# Patient Record
Sex: Male | Born: 1985 | Race: White | Hispanic: No | Marital: Married | Smoking: Former smoker
Health system: Southern US, Community
[De-identification: ages and names within clinical notes are randomized; demographics above are authoritative.]

---

## 2009-06-16 ENCOUNTER — Ambulatory Visit (HOSPITAL_COMMUNITY): Admission: RE | Admit: 2009-06-16 | Discharge: 2009-06-16 | Payer: Self-pay | Admitting: Family Medicine

## 2020-06-03 ENCOUNTER — Encounter: Payer: Self-pay | Admitting: Physical Medicine and Rehabilitation

## 2020-07-17 ENCOUNTER — Encounter: Payer: Self-pay | Admitting: Physical Medicine and Rehabilitation

## 2020-07-17 ENCOUNTER — Other Ambulatory Visit: Payer: Self-pay

## 2020-07-17 ENCOUNTER — Encounter
Payer: No Typology Code available for payment source | Attending: Physical Medicine and Rehabilitation | Admitting: Physical Medicine and Rehabilitation

## 2020-07-17 VITALS — BP 154/92 | HR 75 | Temp 99.0°F | Ht 72.0 in | Wt 265.4 lb

## 2020-07-17 DIAGNOSIS — M7918 Myalgia, other site: Secondary | ICD-10-CM | POA: Diagnosis not present

## 2020-07-17 DIAGNOSIS — Z6835 Body mass index (BMI) 35.0-35.9, adult: Secondary | ICD-10-CM

## 2020-07-17 DIAGNOSIS — E669 Obesity, unspecified: Secondary | ICD-10-CM | POA: Insufficient documentation

## 2020-07-17 MED ORDER — CYCLOBENZAPRINE HCL 10 MG PO TABS: 10.0000 mg | ORAL_TABLET | Freq: Two times a day (BID) | ORAL | 1 refills | Status: AC | PRN

## 2020-07-17 MED ORDER — CELECOXIB 200 MG PO CAPS: 200.0000 mg | ORAL_CAPSULE | Freq: Every day | ORAL | 1 refills | Status: DC

## 2020-07-17 NOTE — Progress Notes (Signed)
Subjective:    Patient ID: Ethan Brooks, male    DOB: May 23, 1986, 35 y.o.   MRN: [id number]  HPI  Ethan Brooks is a 35 year old man who presents to establish care for herniated L5 disc.   1) L5 disc herniation: He recently retired from [employer] in July. 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 This started in 2019. He developed right sided sciatic pain. He received stretches and dry needling. He was tried on medications, physical therapy. He takes a celebrex every day and this does help. His pain is worse in the morning. He has tried Flexeril and this helped in the beginning.   2) Cervical myofscial pain syndorme: he feels a lot of muscle spasms in his upper back and neck. He has tried dry needling and when this was removed he had very good relief.    Pain Inventory Average Pain 4 Pain Right Now 6 My pain is constant, stabbing and aching  In the last 24 hours, has pain interfered with the following? General activity 1 Relation with others 3 Enjoyment of life 3 What TIME of day is your pain at its worst? morning  Sleep (in general) Fair  Pain is worse with: inactivity Pain improves with: therapy/exercise and medication Relief from Meds: 8  walk without assistance how many minutes can you walk? 120+ ability to climb steps?  yes do you drive?  yes  disabled: date disabled 12/20/2020 retired  depression anxiety  new pt  new pt    Family History  Problem Relation Age of Onset  . Diabetes Father   . Heart disease Father    Social History   Socioeconomic History  . Marital status: Married    Spouse name: Not on file  . Number of children: Not on file  . Years of education: Not on file  . Highest education level: Not on file  Occupational History  . Not on file  Tobacco Use  . Smoking status: Former Smoker    Quit date: 06/23/2020    Years since quitting:  0.0  . Smokeless tobacco: Not on file  Substance and Sexual Activity  . Alcohol use: Yes    Alcohol/week: 3.0 standard drinks    Types: 3 Standard drinks or equivalent per week    Comment: occassionally  . Drug use: Not on file  . Sexual activity: Not on file  Other Topics Concern  . Not on file  Social History Narrative  . Not on file   Social Determinants of Health   Financial Resource Strain: Not on file  Food Insecurity: Not on file  Transportation Needs: Not on file  Physical Activity: Not on file  Stress: Not on file  Social Connections: Not on file   No past medical history on file. BP (!) 154/92   Pulse 75   Temp 99 F (37.2 C)   Ht 6' (1.829 m)   Wt 265 lb 6.4 oz (120.4 kg)   SpO2 97%   BMI 35.99 kg/m   Opioid Risk Score:   Fall Risk Score:  `1  Depression screen PHQ 2/9  Depression screen PHQ 2/9 07/17/2020  Decreased Interest 1  Down, Depressed, Hopeless 1  PHQ - 2 Score 2  Altered sleeping 1  Tired, decreased energy 1  Change in appetite 1  Feeling bad or failure about yourself  1  Trouble concentrating 1  Moving slowly or fidgety/restless 1  Suicidal thoughts 0  PHQ-9 Score 8  Difficult doing work/chores Somewhat difficult   Review of Systems  Musculoskeletal: Positive for back pain and neck pain.       Shoulder pain Hip pain Knee pain  All other systems reviewed and are negative.      Objective:   Physical Exam Gen: no distress, normal appearing HEENT: oral mucosa pink and moist, NCAT Cardio: Reg rate Chest: normal effort, normal rate of breathing Abd: soft, non-distended Ext: no edema Psych: pleasant, normal affect Skin: intact Neuro: Alert and oriented  Musculoskeletal: Excellent range of motion on flexion and extension. Tenderness to palpation in upper thoracic spine.      Assessment & Plan:  Ethan Brooks is a 35 year old man who presents to establish care for spinal pain.   1) Cervical myofascial pain syndrome: -Physical  therapy prescribed for stretching and strengthening the muscles of the upper back, HEP. -Discussed benefits of heat, trigger point injections- advised him to let me know if he wants this. -He was on Marinol for chronic pain before. This greatly benefited him. I discussed with our medical director and we do not prescribe Marinol for chronic pain. I will call patient to let him now.  -Prescribed Celebrex 200mg  daily PRN -Prescribed Flexeril 5mg  and advised to use PRN.  -Discussed current symptoms of pain and history of pain.  -Discussed benefits of exercise in reducing pain. -Discussed following foods that may reduce pain: 1) Ginger 2) Blueberries 3) Salmon 4) Pumpkin seeds 5) dark chocolate 6) turmeric 7) tart cherries 8) virgin olive oil 9) chilli peppers 10) mint  Turmeric to reduce inflammation--can be used in cooking or taken as a supplement.  Benefits of turmeric:  -Highly anti-inflammatory  -Increases antioxidants  -Improves memory, attention, brain disease  -Lowers risk of heart disease  -May help prevent cancer  -Decreases pain  -Alleviates depression  -Delays aging and decreases risk of chronic disease  -Consume with black pepper to increase absorption    Turmeric Milk Recipe:  1 cup milk  1 tsp turmeric  1 tsp cinnamon  1 tsp grated ginger (optional)  Black pepper (boosts the anti-inflammatory properties of turmeric).  1 tsp honey  Link to further information on diet for chronic pain:   2) Obesity:  -35.99 BMI, educated that weight loss will also help with pain.  -Recommended exercising as much as tolerated -Recommended down dog app and doing 15mg  of yoga every day.

## 2020-07-17 NOTE — Patient Instructions (Signed)
-  Discussed following foods that may reduce pain: 1) Ginger 2) Blueberries 3) Salmon 4) Pumpkin seeds 5) dark chocolate 6) turmeric 7) tart cherries 8) virgin olive oil 9) chilli peppers 10) mint  Link to further information on diet for chronic pain: http://www.bray.com/   Turmeric to reduce inflammation--can be used in cooking or taken as a supplement.  Benefits of turmeric:  -Highly anti-inflammatory  -Increases antioxidants  -Improves memory, attention, brain disease  -Lowers risk of heart disease  -May help prevent cancer  -Decreases pain  -Alleviates depression  -Delays aging and decreases risk of chronic disease  -Consume with black pepper to increase absorption    Turmeric Milk Recipe:  1 cup milk  1 tsp turmeric  1 tsp cinnamon  1 tsp grated ginger (optional)  Black pepper (boosts the anti-inflammatory properties of turmeric).  1 tsp honey

## 2020-07-18 ENCOUNTER — Other Ambulatory Visit: Payer: Self-pay | Admitting: Physical Medicine and Rehabilitation

## 2020-08-12 ENCOUNTER — Ambulatory Visit
Payer: No Typology Code available for payment source | Attending: Physical Medicine and Rehabilitation | Admitting: Physical Therapy

## 2020-08-12 ENCOUNTER — Other Ambulatory Visit: Payer: Self-pay

## 2020-08-12 ENCOUNTER — Encounter: Payer: Self-pay | Admitting: Physical Therapy

## 2020-08-12 DIAGNOSIS — M546 Pain in thoracic spine: Secondary | ICD-10-CM | POA: Diagnosis not present

## 2020-08-12 DIAGNOSIS — R29898 Other symptoms and signs involving the musculoskeletal system: Secondary | ICD-10-CM | POA: Insufficient documentation

## 2020-08-12 DIAGNOSIS — M542 Cervicalgia: Secondary | ICD-10-CM | POA: Insufficient documentation

## 2020-08-12 NOTE — Therapy (Signed)
[facility] [address] [place], [place], [address] Phone: [phone]   Fax:  [phone]  Physical Therapy Treatment  Patient Details  Name: Ethan Brooks MRN: [id number] Date of Birth: 1986-02-08 Referring Provider (PT): Dr. Sula Soda   Encounter Date: 08/12/2020   PT End of Session - 08/12/20 1206    Visit Number 1    Number of Visits 12    Date for PT Re-Evaluation 09/23/20    Authorization Type [facility]    PT Start Time 1123    PT Stop Time 1208    PT Time Calculation (min) 45 min    Activity Tolerance Patient tolerated treatment well    Behavior During Therapy [facility] for tasks assessed/performed           History reviewed. No pertinent past medical history.  History reviewed. No pertinent surgical history.  There were no vitals filed for this visit.   Subjective Assessment - 08/12/20 1129    Subjective Patient presents with chronic upper back pain, worsening in the past few months He admits to losing momnetum with exercisese, poor posture.  He does notice increased pain with stress.  He denies weakness in arms, tinging or radiation of pain .  He does have chronic sciatic nerve pain from a previous injury. 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 He lives a with a certain amount of pain and reports not being physically limited by his pain    Pertinent History L5 disc herniation  July 2019    Diagnostic tests none    Currently in Pain? Yes    Pain Score 4     Pain Location Back    Pain Orientation Right;Upper    Pain Descriptors / Indicators Sore;Tightness    Pain Type Chronic pain    Pain Onset More than a month ago    Pain Frequency Intermittent    Aggravating Factors  being sedentary    Pain Relieving Factors activity, PT    Effect of Pain on Daily Activities feels less energy, overall not as strong as she would like to be    Multiple Pain Sites No              [facility]  PT Assessment - 08/12/20 0001      Assessment   Medical Diagnosis cervical pain, myofascial pain    Referring Provider (PT) Dr. 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[employer], Microbiologist, friends , dog      Cognition   Overall Cognitive Status Within Functional Limits for tasks assessed      Observation/Other Assessments   Focus on Therapeutic Outcomes (FOTO)  59%      Sensation   Light Touch Appears Intact      Posture/Postural Control   Posture/Postural Control Postural limitations    Postural Limitations  Rounded Shoulders;Forward head;Increased thoracic kyphosis    Posture Comments rounded , scapula abducted downward sloped shoulders      AROM   Cervical Flexion 55    Cervical Extension 70    Cervical - Right Side Bend 55    Cervical - Left Side Bend 58    Cervical - Right Rotation WFL    Cervical - Left Rotation WFL    Lumbar Flexion WNL    Lumbar Extension WNL    Lumbar - Right Side Bend WNL    Lumbar - Left Side Bend WNL pain in R    Lumbar - Right Rotation WNL    Lumbar - Left Rotation WNL      Strength   Right Shoulder Flexion 5/5    Right Shoulder ABduction 5/5    Right Shoulder Internal Rotation 5/5    Right Shoulder External Rotation 5/5    Right Shoulder Horizontal ABduction 4/5    Left Shoulder Flexion 5/5    Left Shoulder ABduction 5/5    Left Shoulder Internal Rotation 5/5    Left Shoulder External Rotation 5/5    Left Shoulder Horizontal ABduction 4/5      Palpation   Spinal mobility  normal throughout cervical and mild stiffness in thoracic spine    Palpation comment no pain with cervical palpation, tenderness in upper traps, levator scap rhomboids, paraspinals in thoracic spine                  PT Education - 08/12/20 1508    Education Details posture, PT, strengthening    Person(s) Educated Patient    Methods Explanation;Handout    Comprehension Verbalized understanding               PT Long Term Goals - 08/12/20 1207      PT LONG TERM GOAL #1   Title Patient will increase physical activity and be consistent with daily HEP for strengthening, mobility.    Time 6    Period Weeks    Status New    Target Date 09/23/20      PT LONG TERM GOAL #2   Title Patient will have normal AROM in cervical spine and no pain    Baseline min pain with extension and lateral flexion    Time 6    Period Weeks    Status New    Target Date 09/23/20      PT LONG TERM GOAL #3   Title Pt will be able to lift with proper form , floor to waist /shoulder height with no increase in pain (50 lbs)    Time 6    Period Weeks    Status New    Target Date 09/23/20      PT LONG TERM GOAL #4   Title Pt will improve FOTO score to 79% or more to demo improved functional mobility    Baseline 59%    Time 6    Period Weeks    Status New    Target Date 09/23/20                 Plan - 08/12/20 1511    Clinical Impression Statement Patient presents for low complexity eval of neck and uppoer back pain which is ongoing and chronic. He is under the care of pain mgmt for a previous lumbar injury, which was treated successfully with PT in 2019. He presents with mild limitations in cervical ROM, strength in periscapular area and poor posture resulting  in a baseline of upper back pain and tighrtness.  He would like to be able to get back on a core routine and is hopeful that some progress will give him the motivation he needs to feel stronger and more capable.  He should do quite  well with 8-12 visits of PT.    Personal Factors and Comorbidities Comorbidity 1    Comorbidities lumbar injury    Examination-Activity Limitations Bend;Lift;Squat;Carry    Examination-Participation Restrictions Yard Work    Stability/Clinical Decision Making Stable/Uncomplicated    Clinical Decision Making Low    Rehab Potential Excellent    PT Frequency 2x / week   1-2 times per week   PT Duration 6 weeks    PT Treatment/Interventions ADLs/Self Care Home Management;Cryotherapy;Therapeutic exercise;Patient/family education;Taping;Manual techniques;Dry needling;Therapeutic activities;Moist Heat    PT Next Visit Plan check HEP, post chain /postural strength.  DL, squat    PT Home Exercise Plan Access Code: [email or web address]  see pt instructions    Consulted and Agree with Plan of Care Patient           Patient will benefit from skilled therapeutic intervention in order to improve the following deficits and impairments:  Pain,Impaired UE functional use,Impaired flexibility,Increased fascial restricitons,Postural dysfunction,Decreased strength,Obesity  Visit Diagnosis: Pain in thoracic spine  Other symptoms and signs involving the musculoskeletal system  Cervicalgia     Problem List There are no problems to display for this patient.   Vaneta Hammontree 08/12/2020, 3:20 PM  [facility] [address] [place], [place], [address] Phone: [phone]   Fax:  [phone]  Name: Ethan Brooks MRN: [id number] Date of Birth: 1985/07/30  Karie Mainland, PT 08/12/20 3:21 PM Phone: [phone] Fax: [phone]

## 2020-08-12 NOTE — Patient Instructions (Signed)
[email or web address]   Access Code: [email or web address]: [email or web address]: 03/01/2022Prepared by: Victorino Dike PaaExercises  Seated Cervical Sidebending Stretch - 2 x daily - 7 x weekly - 1 sets - 3 reps - 30 hold  Seated Levator Scapulae Stretch - 2 x daily - 7 x weekly - 1 sets - 3 reps - 30 hold  Supine Shoulder Horizontal Abduction with Resistance - 2 x daily - 7 x weekly - 2 sets - 10 reps - 5 hold  Corner Pec Major Stretch - 2 x daily - 7 x weekly - 1 sets - 5 reps - 30 hold  Prone Scapular Retraction - 1 x daily - 7 x weekly - 2 sets - 10 reps - 5 hold  Prone Scapular Retraction Arms at Side - 1 x daily - 7 x weekly - 2 sets - 10 reps - 5 hold  Prone W Scapular Retraction - 1 x daily - 7 x weekly - 2 sets - 10 reps - 5 hold

## 2020-08-14 ENCOUNTER — Other Ambulatory Visit: Payer: Self-pay

## 2020-08-14 ENCOUNTER — Encounter: Payer: Self-pay | Admitting: Physical Therapy

## 2020-08-14 ENCOUNTER — Ambulatory Visit: Payer: No Typology Code available for payment source | Admitting: Physical Therapy

## 2020-08-14 DIAGNOSIS — M546 Pain in thoracic spine: Secondary | ICD-10-CM | POA: Diagnosis not present

## 2020-08-14 DIAGNOSIS — R29898 Other symptoms and signs involving the musculoskeletal system: Secondary | ICD-10-CM

## 2020-08-14 DIAGNOSIS — M542 Cervicalgia: Secondary | ICD-10-CM

## 2020-08-14 NOTE — Therapy (Signed)
[facility] [address] [place], [place], [address] Phone: [phone]   Fax:  [phone]  Physical Therapy Treatment  Patient Details  Name: Ethan Brooks MRN: [id number] Date of Birth: [birth year] Referring Provider (PT): Dr. Sula Soda   Encounter Date: 08/14/2020   PT End of Session - 08/14/20 0839    Visit Number 2    Number of Visits 12    Date for PT Re-Evaluation 09/23/20    Authorization Type [facility]    PT Start Time 0800    PT Stop Time 0845    PT Time Calculation (min) 45 min           History reviewed. No pertinent past medical history.  History reviewed. No pertinent surgical history.  There were no vitals filed for this visit.   Subjective Assessment - 08/14/20 0809    Subjective "I haven't gotten around to do my exercises I've been doing my renovation at home."    Currently in Pain? Yes    Pain Score 3     Pain Location Thoracic    Pain Orientation Left    Pain Descriptors / Indicators Aching;Tightness    Pain Type Chronic pain    Pain Onset More than a month ago    Pain Frequency Intermittent    Aggravating Factors  sitting for long periods of time                             [facility] Adult PT Treatment/Exercise - 08/14/20 0001      Neck Exercises: Machines for Strengthening   UBE (Upper Arm Bike) L5 x 4 min   fwd/bwd x 2 min ea.     Shoulder Exercises: Stretch   Other Shoulder Stretches crossed arm rhomboid stretch 2 x 30 sec   cues to only hold 30 seconds     Manual Therapy   Manual Therapy Soft tissue mobilization;Joint mobilization    Manual therapy comments skilled palpation and monitoring thorughout DN    Joint Mobilization T4-T10 PA grade IV    Soft tissue mobilization IASTM along bil thoracic paraspinals            Trigger Point Dry Needling - 08/14/20 0001    Consent Given? Yes    Education Handout Provided Yes    Muscles Treated Back/Hip Thoracic multifidi     Electrical Stimulation Performed with Dry Needling Yes    E-stim with Dry Needling Details frequency at 20 - intensity to tolerance and increasing to tolerance intermittently x 8 min    Thoracic multifidi response Palpable increased muscle length;Twitch response elicited   bil T5 & T8               PT Education - 08/14/20 0839    Education Details reviewed HEP and discussed only stretching for 30 second and longer isn't better. Reivewed DN and benefits of treatment.    Person(s) Educated Patient    Methods Explanation;Verbal cues    Comprehension Verbalized understanding;Verbal cues required               PT Long Term Goals - 08/12/20 1207      PT LONG TERM GOAL #1   Title Patient will increase physical activity and be consistent with daily HEP for strengthening, mobility.    Time 6    Period Weeks    Status New    Target Date 09/23/20  PT LONG TERM GOAL #2   Title Patient will have normal AROM in cervical spine and no pain    Baseline min pain with extension and lateral flexion    Time 6    Period Weeks    Status New    Target Date 09/23/20      PT LONG TERM GOAL #3   Title Pt will be able to lift with proper form , floor to waist /shoulder height with no increase in pain (50 lbs)    Time 6    Period Weeks    Status New    Target Date 09/23/20      PT LONG TERM GOAL #4   Title Pt will improve FOTO score to 79% or more to demo improved functional mobility    Baseline 59%    Time 6    Period Weeks    Status New    Target Date 09/23/20                 Plan - 08/14/20 0845    Clinical Impression Statement pt reprots limited compliance with HEP due to renvating his home. reviewed stretch and to avoid over stretching to avoid potentially causing muscle aggrivation. educated and consent was provided for TPDN for bil thoracic multifidi combined with E-stim. followed with mobs and IASTM techniques. end of session he reported decreased pain/ tension.     PT Treatment/Interventions ADLs/Self Care Home Management;Cryotherapy;Therapeutic exercise;Patient/family education;Taping;Manual techniques;Dry needling;Therapeutic activities;Moist Heat    PT Next Visit Plan check HEP, post chain /postural strength.  DL, squat, response to DN.    PT Home Exercise Plan Access Code: [email or web address]  see pt instructions    Consulted and Agree with Plan of Care Patient           Patient will benefit from skilled therapeutic intervention in order to improve the following deficits and impairments:  Pain,Impaired UE functional use,Impaired flexibility,Increased fascial restricitons,Postural dysfunction,Decreased strength,Obesity  Visit Diagnosis: Pain in thoracic spine  Other symptoms and signs involving the musculoskeletal system  Cervicalgia     Problem List There are no problems to display for this patient.   Lulu Riding PT, DPT, LAT, ATC  08/14/20  8:47 AM      [facility] [address] [place], [place], [address] Phone: [phone]   Fax:  [phone]  Name: Ethan Brooks MRN: [id number] Date of Birth: 26-Oct-1985

## 2020-09-01 ENCOUNTER — Other Ambulatory Visit: Payer: Self-pay | Admitting: *Deleted

## 2020-09-01 MED ORDER — CELECOXIB 200 MG PO CAPS: ORAL_CAPSULE | ORAL | 0 refills | Status: AC

## 2020-09-02 ENCOUNTER — Other Ambulatory Visit: Payer: Self-pay

## 2020-09-02 ENCOUNTER — Ambulatory Visit: Payer: No Typology Code available for payment source | Admitting: Physical Therapy

## 2020-09-02 DIAGNOSIS — M546 Pain in thoracic spine: Secondary | ICD-10-CM | POA: Diagnosis not present

## 2020-09-02 DIAGNOSIS — M542 Cervicalgia: Secondary | ICD-10-CM

## 2020-09-02 DIAGNOSIS — R29898 Other symptoms and signs involving the musculoskeletal system: Secondary | ICD-10-CM

## 2020-09-02 NOTE — Therapy (Signed)
[facility] Outpatient Rehabilitation [facility] [address] [place], [place], [address] Phone: [phone]   Fax:  [phone]  Physical Therapy Treatment  Patient Details  Name: RAMIE ERMAN MRN: [id number] Date of Birth: 08-25-1985 Referring Provider (PT): Dr. Sula Soda   Encounter Date: 09/02/2020   PT End of Session - 09/02/20 1336    Visit Number 3    Number of Visits 12    Date for PT Re-Evaluation 09/23/20    Authorization Type [facility]    Authorization - Visit Number 3    Authorization - Number of Visits 13    PT Start Time 1334    PT Stop Time 1412    PT Time Calculation (min) 38 min    Activity Tolerance Patient tolerated treatment well    Behavior During Therapy [facility] for tasks assessed/performed           No past medical history on file.  No past surgical history on file.  There were no vitals filed for this visit.   Subjective Assessment - 09/02/20 1337    Subjective "I am doing phenominal today, I was alittle sore after the last session for a day to day."    Currently in Pain? Yes    Pain Score 1     Pain Orientation Left              [facility] PT Assessment - 09/02/20 0001      Assessment   Medical Diagnosis cervical pain, myofascial pain    Referring Provider (PT) Dr. Sula Soda                         [facility] Adult PT Treatment/Exercise - 09/02/20 0001      Self-Care   Self-Care Other Self-Care Comments    Other Self-Care Comments  how to perform Self trigger point release and tools that can assist with treatment      Neck Exercises: Seated   Other Seated Exercise rows with green theraband x 20, money x 20, shoulder extension x 20, horizontal abd x 20, marches LE only x 20,   seated on green physioball     Lumbar Exercises: Stretches   Other Lumbar Stretch Exercise low back stretch seated walking hands out onto physioball 2 x forward, 1x to R/ L holding 30 sec      Lumbar Exercises: Aerobic    Elliptical L3 x 5 min ramp L 10      Lumbar Exercises: Seated   Other Seated Lumbar Exercises alt UE/LE marches x 20      Lumbar Exercises: Supine   Other Supine Lumbar Exercises laying on fosm roller parallel to spine marching 2 x 10 alt l/r   cues to keep core tight and let hands hover to reduce falling     Shoulder Exercises: Supine   Other Supine Exercises thoracic exgension over bolser with hands behind head and elbows adducted 1 x 10 holding 3 sec ea.      Shoulder Exercises: Stretch   Other Shoulder Stretches pec stretch laying on foam roller 2 x 20                  PT Education - 09/02/20 1412    Education Details reviewed sitting posture using rolled towel behind back.    Person(s) Educated Patient    Methods Explanation;Verbal cues;Handout    Comprehension Verbalized understanding;Verbal cues required  PT Long Term Goals - 08/12/20 1207      PT LONG TERM GOAL #1   Title Patient will increase physical activity and be consistent with daily HEP for strengthening, mobility.    Time 6    Period Weeks    Status New    Target Date 09/23/20      PT LONG TERM GOAL #2   Title Patient will have normal AROM in cervical spine and no pain    Baseline min pain with extension and lateral flexion    Time 6    Period Weeks    Status New    Target Date 09/23/20      PT LONG TERM GOAL #3   Title Pt will be able to lift with proper form , floor to waist /shoulder height with no increase in pain (50 lbs)    Time 6    Period Weeks    Status New    Target Date 09/23/20      PT LONG TERM GOAL #4   Title Pt will improve FOTO score to 79% or more to demo improved functional mobility    Baseline 59%    Time 6    Period Weeks    Status New    Target Date 09/23/20                 Plan - 09/02/20 1413    Clinical Impression Statement pt arrived noting significant relief of pain noting 1/1 today. focused on addressing core and shoulder in both  supine and seated position. practiced strengthening on unstable surfaces to require core activation and maintaining efficient posture. Pt responded well treatment and noted no increaed in pain following session.    PT Treatment/Interventions ADLs/Self Care Home Management;Cryotherapy;Therapeutic exercise;Patient/family education;Taping;Manual techniques;Dry needling;Therapeutic activities;Moist Heat    PT Next Visit Plan check HEP, post chain /postural strength.  DL, squat, response to DN PRN, continued working on aggressive shoulder and core strength.    Consulted and Agree with Plan of Care Patient           Patient will benefit from skilled therapeutic intervention in order to improve the following deficits and impairments:  Pain,Impaired UE functional use,Impaired flexibility,Increased fascial restricitons,Postural dysfunction,Decreased strength,Obesity  Visit Diagnosis: Pain in thoracic spine  Other symptoms and signs involving the musculoskeletal system  Cervicalgia     Problem List There are no problems to display for this patient.   Lulu Riding PT, DPT, LAT, ATC  09/02/20  2:16 PM      [facility] [address] [place], [place], [address] Phone: [phone]   Fax:  [phone]  Name: MAIKA KACZMAREK MRN: [id number] Date of Birth: 09-21-1985

## 2020-09-04 ENCOUNTER — Ambulatory Visit: Payer: No Typology Code available for payment source | Admitting: Physical Therapy

## 2020-09-04 ENCOUNTER — Other Ambulatory Visit: Payer: Self-pay

## 2020-09-04 DIAGNOSIS — M542 Cervicalgia: Secondary | ICD-10-CM

## 2020-09-04 DIAGNOSIS — R29898 Other symptoms and signs involving the musculoskeletal system: Secondary | ICD-10-CM

## 2020-09-04 DIAGNOSIS — M546 Pain in thoracic spine: Secondary | ICD-10-CM

## 2020-09-04 NOTE — Therapy (Signed)
[facility] Outpatient Rehabilitation [facility] [address] [place], [place], [address] Phone: [phone]   Fax:  [phone]  Physical Therapy Treatment  Patient Details  Name: Ethan Brooks MRN: [id number] Date of Birth: 31-May-1986 Referring Provider (PT): Dr. Sula Soda   Encounter Date: 09/04/2020   PT End of Session - 09/04/20 1420    Visit Number 4    Number of Visits 12    Date for PT Re-Evaluation 09/23/20    Authorization Time Period 05/22/2020 - 11/18/2020    Authorization - Visit Number 4    Authorization - Number of Visits 13    PT Start Time 1330    PT Stop Time 1415    PT Time Calculation (min) 45 min    Activity Tolerance Patient tolerated treatment well    Behavior During Therapy [facility] for tasks assessed/performed           No past medical history on file.  No past surgical history on file.  There were no vitals filed for this visit.   Subjective Assessment - 09/04/20 1334    Subjective "I am doing pretty good, but can feel like it is coming back."    Pain Score 3     Pain Orientation Left    Pain Descriptors / Indicators Aching;Tightness    Pain Type Chronic pain    Pain Onset More than a month ago    Pain Frequency Intermittent    Aggravating Factors  sitting for long periods of time    Pain Relieving Factors activity, exercise.              [facility] PT Assessment - 09/04/20 0001      Assessment   Medical Diagnosis cervical pain, myofascial pain    Referring Provider (PT) Dr. Sula Soda                         [facility] Adult PT Treatment/Exercise - 09/04/20 0001      Lumbar Exercises: Seated   Other Seated Lumbar Exercises seated lower trap 2 x 15  with greenband      Shoulder Exercises: Prone   Other Prone Exercises I's T's and Y's 2 x 10 bil prone on green physionall   second set with 1# weight bil     Manual Therapy   Manual therapy comments skilled palpation and monitoring thorughout DN     Joint Mobilization T4-T10 PA grade IV, TC juntion grade IV mob    Soft tissue mobilization IASTM along bil thoracic paraspinals            Trigger Point Dry Needling - 09/04/20 0001    Consent Given? Yes    Education Handout Provided Previously provided    E-stim with Dry Needling Details frequency at 20 - intensity to tolerance and increasing to tolerance intermittently x 8 min    Thoracic multifidi response Palpable increased muscle length;Twitch response elicited                     PT Long Term Goals - 08/12/20 1207      PT LONG TERM GOAL #1   Title Patient will increase physical activity and be consistent with daily HEP for strengthening, mobility.    Time 6    Period Weeks    Status New    Target Date 09/23/20      PT LONG TERM GOAL #2   Title Patient will have normal AROM in  cervical spine and no pain    Baseline min pain with extension and lateral flexion    Time 6    Period Weeks    Status New    Target Date 09/23/20      PT LONG TERM GOAL #3   Title Pt will be able to lift with proper form , floor to waist /shoulder height with no increase in pain (50 lbs)    Time 6    Period Weeks    Status New    Target Date 09/23/20      PT LONG TERM GOAL #4   Title Pt will improve FOTO score to 79% or more to demo improved functional mobility    Baseline 59%    Time 6    Period Weeks    Status New    Target Date 09/23/20                 Plan - 09/04/20 1416    Clinical Impression Statement pt reports still doing well but continues to feel some tension inthe upper thoracic region. continued TPDN focusing on the upper thoracic parapsinals combined with estim followed with thoracic mobilizations. continued working on posterior shoulder strengthening using phyisoball to incorporate core activation. end of session he noted decreaesd pain / tension.    PT Treatment/Interventions ADLs/Self Care Home Management;Cryotherapy;Therapeutic exercise;Patient/family  education;Taping;Manual techniques;Dry needling;Therapeutic activities;Moist Heat    PT Next Visit Plan check HEP, post chain /postural strength.  DL, squat, response to DN PRN, continued working on aggressive shoulder and core strength.    PT Home Exercise Plan Access Code: [email or web address]  see pt instructions    Consulted and Agree with Plan of Care Patient           Patient will benefit from skilled therapeutic intervention in order to improve the following deficits and impairments:  Pain,Impaired UE functional use,Impaired flexibility,Increased fascial restricitons,Postural dysfunction,Decreased strength,Obesity  Visit Diagnosis: Pain in thoracic spine  Other symptoms and signs involving the musculoskeletal system  Cervicalgia     Problem List There are no problems to display for this patient.  Lulu Riding PT, DPT, LAT, ATC  09/04/20  2:21 PM      [facility] [address] [place], [place], [address] Phone: [phone]   Fax:  [phone]  Name: Ethan Brooks MRN: [id number] Date of Birth: [birth year]

## 2020-09-09 ENCOUNTER — Telehealth: Payer: Self-pay | Admitting: Physical Therapy

## 2020-09-09 ENCOUNTER — Ambulatory Visit: Payer: No Typology Code available for payment source | Admitting: Physical Therapy

## 2020-09-09 NOTE — Telephone Encounter (Signed)
Spoke with pt regarding missed appointment today. He reported was completely unaware of the time and had it entered in his phone at 1:30 vs 11. I reviewed his upcoming visits/ times which he confirmed were accurate in his phone. I told him we will plan to see him next visit, and if he cannot make it to call and we can cancel or reschedule that appointment for him.  Dalary Hollar PT, DPT, LAT, ATC  09/09/20  12:56 PM

## 2020-09-11 ENCOUNTER — Other Ambulatory Visit: Payer: Self-pay

## 2020-09-11 ENCOUNTER — Ambulatory Visit: Payer: No Typology Code available for payment source | Admitting: Physical Therapy

## 2020-09-11 DIAGNOSIS — R29898 Other symptoms and signs involving the musculoskeletal system: Secondary | ICD-10-CM

## 2020-09-11 DIAGNOSIS — M542 Cervicalgia: Secondary | ICD-10-CM

## 2020-09-11 DIAGNOSIS — M546 Pain in thoracic spine: Secondary | ICD-10-CM

## 2020-09-11 NOTE — Therapy (Signed)
[facility] Outpatient Rehabilitation [facility] [address] [place], [place], [address] Phone: [phone]   Fax:  [phone]  Physical Therapy Treatment  Patient Details  Name: BRYCIN KILLE MRN: [id number] Date of Birth: [birth year] Referring Provider (PT): Dr. Sula Soda   Encounter Date: 09/11/2020   PT End of Session - 09/11/20 1334    Visit Number 5    Number of Visits 12    Date for PT Re-Evaluation 09/23/20    Authorization Type [facility]    Authorization Time Period 05/22/2020 - 11/18/2020    Authorization - Visit Number 5    Authorization - Number of Visits 13    PT Start Time 1333    PT Stop Time 1413    PT Time Calculation (min) 40 min           No past medical history on file.  No past surgical history on file.  There were no vitals filed for this visit.   Subjective Assessment - 09/11/20 1335    Subjective " the massage helped alot, I was sore the next day but I am feeling better."    Currently in Pain? Yes              [facility] PT Assessment - 09/11/20 0001      Assessment   Medical Diagnosis cervical pain, myofascial pain    Referring Provider (PT) Dr. Sula Soda                         [facility] Adult PT Treatment/Exercise - 09/11/20 0001      Neck Exercises: Machines for Strengthening   UBE (Upper Arm Bike) L5 x 6 min   fwd/bwd x 3 min     Lumbar Exercises: Seated   Other Seated Lumbar Exercises palloff press 2 x 20 on free motion      Shoulder Exercises: Standing   Other Standing Exercises palloff press 2 x 20 20#    Other Standing Exercises TRX rows 2 x 20      Shoulder Exercises: Body Blade   ABduction 30 seconds;3 reps   forward/ laterally   Other Body Blade Exercises IR/ER 3 x 30 min    Other Body Blade Exercises bicep 3 x 30 min                  PT Education - 09/11/20 1403    Education Details reviewed posture and benefits of a lumbar roll to help maintain posture    Person(s)  Educated Patient    Methods Explanation;Verbal cues    Comprehension Verbalized understanding;Verbal cues required               PT Long Term Goals - 08/12/20 1207      PT LONG TERM GOAL #1   Title Patient will increase physical activity and be consistent with daily HEP for strengthening, mobility.    Time 6    Period Weeks    Status New    Target Date 09/23/20      PT LONG TERM GOAL #2   Title Patient will have normal AROM in cervical spine and no pain    Baseline min pain with extension and lateral flexion    Time 6    Period Weeks    Status New    Target Date 09/23/20      PT LONG TERM GOAL #3   Title Pt will be able to lift with proper form ,  floor to waist /shoulder height with no increase in pain (50 lbs)    Time 6    Period Weeks    Status New    Target Date 09/23/20      PT LONG TERM GOAL #4   Title Pt will improve FOTO score to 79% or more to demo improved functional mobility    Baseline 59%    Time 6    Period Weeks    Status New    Target Date 09/23/20                 Plan - 09/11/20 1352    Clinical Impression Statement pt arrives reporting minimal soreness and felt DN wasn't needed today.Focused session on core and shoulder strengthening with progressive loading which he did well with today. end of session he noted feeling pretty good during and following session.    PT Treatment/Interventions ADLs/Self Care Home Management;Cryotherapy;Therapeutic exercise;Patient/family education;Taping;Manual techniques;Dry needling;Therapeutic activities;Moist Heat    PT Next Visit Plan check HEP, post chain /postural strength.  DL, squat, response to DN PRN, continued working on aggressive shoulder and core strength. body blade    Consulted and Agree with Plan of Care Patient           Patient will benefit from skilled therapeutic intervention in order to improve the following deficits and impairments:     Visit Diagnosis: Pain in thoracic  spine  Other symptoms and signs involving the musculoskeletal system  Cervicalgia     Problem List There are no problems to display for this patient.  Lulu Riding PT, DPT, LAT, ATC  09/11/20  2:11 PM      [facility] Outpatient Rehabilitation [facility] [address] [place], [place], [address] Phone: [phone]   Fax:  [phone]  Name: RIELY BASKETT MRN: [id number] Date of Birth: [birth year]

## 2020-09-16 ENCOUNTER — Encounter: Payer: Self-pay | Admitting: Physical Therapy

## 2020-09-16 ENCOUNTER — Ambulatory Visit
Payer: No Typology Code available for payment source | Attending: Physical Medicine and Rehabilitation | Admitting: Physical Therapy

## 2020-09-16 ENCOUNTER — Other Ambulatory Visit: Payer: Self-pay

## 2020-09-16 DIAGNOSIS — M542 Cervicalgia: Secondary | ICD-10-CM | POA: Diagnosis present

## 2020-09-16 DIAGNOSIS — M546 Pain in thoracic spine: Secondary | ICD-10-CM | POA: Insufficient documentation

## 2020-09-16 DIAGNOSIS — R29898 Other symptoms and signs involving the musculoskeletal system: Secondary | ICD-10-CM | POA: Insufficient documentation

## 2020-09-16 NOTE — Therapy (Signed)
[facility] Outpatient Rehabilitation [facility] [address] [place], [place], [address] Phone: [phone]   Fax:  [phone]  Physical Therapy Treatment  Patient Details  Name: JOUSHUA DUGAR MRN: [id number] Date of Birth: 1985/12/06 Referring Provider (PT): Dr. Sula Soda   Encounter Date: 09/16/2020   PT End of Session - 09/16/20 1226    Visit Number 6    Number of Visits 12    Date for PT Re-Evaluation 09/23/20    Authorization Type [facility]    Authorization Time Period 05/22/2020 - 11/18/2020    Authorization - Visit Number 6    Authorization - Number of Visits 13    PT Start Time 1100    PT Stop Time 1142    PT Time Calculation (min) 42 min    Activity Tolerance Patient tolerated treatment well    Behavior During Therapy [facility] for tasks assessed/performed           History reviewed. No pertinent past medical history.  History reviewed. No pertinent surgical history.  There were no vitals filed for this visit.   Subjective Assessment - 09/16/20 1107    Subjective " i was feeling pretty sore in my hips/ low back    Currently in Pain? No/denies    Pain Orientation Left              [facility] PT Assessment - 09/16/20 0001      Assessment   Medical Diagnosis cervical pain, myofascial pain    Referring Provider (PT) Dr. Sula Soda                         [facility] Adult PT Treatment/Exercise - 09/16/20 0001      Lumbar Exercises: Stretches   Lower Trunk Rotation 2 reps;30 seconds   bil hip     Lumbar Exercises: Aerobic   Elliptical L5 x ramp L5      Lumbar Exercises: Standing   Other Standing Lumbar Exercises dead lift 1 x 10 25#, 1 x 10 45# kettlebell   demonstration and cues for proper form     Lumbar Exercises: Supine   Dead Bug 5 reps   20 sec hold combined with PPT and ADIM   Dead Bug Limitations progressed to contralateral UE/LE movement   using red physioball to promote form     Shoulder Exercises: Prone    Other Prone Exercises quadruped 2 x 10 I/T/Y 2#                       PT Long Term Goals - 08/12/20 1207      PT LONG TERM GOAL #1   Title Patient will increase physical activity and be consistent with daily HEP for strengthening, mobility.    Time 6    Period Weeks    Status New    Target Date 09/23/20      PT LONG TERM GOAL #2   Title Patient will have normal AROM in cervical spine and no pain    Baseline min pain with extension and lateral flexion    Time 6    Period Weeks    Status New    Target Date 09/23/20      PT LONG TERM GOAL #3   Title Pt will be able to lift with proper form , floor to waist /shoulder height with no increase in pain (50 lbs)    Time 6    Period Weeks  Status New    Target Date 09/23/20      PT LONG TERM GOAL #4   Title Pt will improve FOTO score to 79% or more to demo improved functional mobility    Baseline 59%    Time 6    Period Weeks    Status New    Target Date 09/23/20                 Plan - 09/16/20 1227    Clinical Impression Statement pt reported soreness in the low back following last session and stiffness in his hips. Today he notes he is doing well. continued focus on posteiror shoulder strengthening in quadruped positioning, and core/ back strengthening. practiced lifting mechanics using kettle bell which he completed but was fatigued following session.    PT Next Visit Plan check HEP, FOTO post chain /postural strength.  DL, squat, response to DN PRN, continued working on aggressive shoulder and core strength. body blade    PT Home Exercise Plan Access Code: [email or web address]  see pt instructions    Consulted and Agree with Plan of Care Patient           Patient will benefit from skilled therapeutic intervention in order to improve the following deficits and impairments:     Visit Diagnosis: Pain in thoracic spine  Other symptoms and signs involving the musculoskeletal system  Cervicalgia     Problem  List There are no problems to display for this patient.  Lulu Riding PT, DPT, LAT, ATC  09/16/20  12:29 PM      [facility] [address] [place], [place], [address] Phone: [phone]   Fax:  [phone]  Name: DELSIN COPEN MRN: [id number] Date of Birth: Oct 29, 1985

## 2020-09-19 ENCOUNTER — Ambulatory Visit: Payer: No Typology Code available for payment source | Admitting: Physical Therapy

## 2020-09-19 ENCOUNTER — Other Ambulatory Visit: Payer: Self-pay

## 2020-09-19 DIAGNOSIS — M546 Pain in thoracic spine: Secondary | ICD-10-CM | POA: Diagnosis not present

## 2020-09-19 DIAGNOSIS — M542 Cervicalgia: Secondary | ICD-10-CM

## 2020-09-19 DIAGNOSIS — R29898 Other symptoms and signs involving the musculoskeletal system: Secondary | ICD-10-CM

## 2020-09-19 NOTE — Therapy (Signed)
[place], [place], [address] Phone: [phone]   Fax:  [phone]  Physical Therapy Treatment / Re-certification  Patient Details  Name: Ethan Brooks MRN: [id number] Date of Birth: 1985-10-26 Referring Provider (PT): Dr. Leeroy Cha   Encounter Date: 09/19/2020   PT End of Session - 09/19/20 0941    Visit Number 7    Number of Visits 12    Date for PT Re-Evaluation 10/31/20    Authorization Type VA    Authorization Time Period 05/22/2020 - 11/18/2020    Authorization - Visit Number 7    Authorization - Number of Visits 15    PT Start Time 0933    Activity Tolerance Patient tolerated treatment well    Behavior During Therapy [facility] for tasks assessed/performed           No past medical history on file.  No past surgical history on file.  There were no vitals filed for this visit.   Subjective Assessment - 09/19/20 0933    Subjective "I am feeling more tension in the low back today the upper back is doing well. "    Currently in Pain? Yes    Pain Score 5     Pain Location Back    Pain Orientation Lower;Mid    Pain Descriptors / Indicators Aching    Pain Onset More than a month ago    Pain Frequency Intermittent    Aggravating Factors  leaning over    Pain Relieving Factors activity              [facility] PT Assessment - 09/19/20 0001      Assessment   Medical Diagnosis cervical pain, myofascial pain    Referring Provider (PT) Dr. Leeroy Cha      Observation/Other Assessments   Focus on Therapeutic Outcomes (FOTO)  66%      AROM   Cervical Flexion 60    Cervical Extension 70    Cervical - Right Side Bend 60    Cervical - Left Side Bend 58    Cervical - Right Rotation 72    Cervical - Left Rotation 72                         [facility] Adult PT Treatment/Exercise - 09/19/20 0001      Lumbar Exercises: Stretches   Single Knee to Chest Stretch Left;Right;1  rep;30 seconds    Lower Trunk Rotation Limitations 1 x 20    Other Lumbar Stretch Exercise childs post 2 x 30 from table      Lumbar Exercises: Sidelying   Other Sidelying Lumbar Exercises side plank 4 x 20 bil      Manual Therapy   Manual therapy comments skilled palpation and monitoring thorughout DN    Soft tissue mobilization IASTM along lumbar paraspinlas            Trigger Point Dry Needling - 09/19/20 0001    Consent Given? Yes    Education Handout Provided Previously provided    Muscles Treated Back/Hip Lumbar multifidi    Lumbar multifidi Response Twitch response elicited;Palpable increased muscle length   bil lumbar paraspinals               PT Education - 09/19/20 0957    Education Details Reviewed HEP and discussed benefits of consistency with his HEP to promote gradually strengthening.    Person(s) Educated Patient    Methods  Explanation;Verbal cues;Handout    Comprehension Verbalized understanding;Verbal cues required               PT Long Term Goals - 09/19/20 0942      PT LONG TERM GOAL #1   Title Patient will increase physical activity and be consistent with daily HEP for strengthening, mobility.    Period Weeks    Status On-going      PT LONG TERM GOAL #2   Title Patient will have normal AROM in cervical spine and no pain    Period Weeks    Status Achieved      PT LONG TERM GOAL #3   Title Pt will be able to lift with proper form , floor to waist /shoulder height with no increase in pain (50 lbs)    Period Weeks    Status Partially Met      PT LONG TERM GOAL #4   Title Pt will improve FOTO score to 79% or more to demo improved functional mobility    Period Weeks    Status On-going                 Plan - 09/19/20 0959    Clinical Impression Statement Saharsh is making good progress with physical therapy noting no pain in his thoracic spine but reported the pain is more in his low back today. He is making good progress toward  his goals and overall function. continued TPDN focusing on the lumbar multifidi followed with IASTM Techniques and stretching. Reviewed the POC dropping to 1 x week for the next 4 weeks, and focus will be placed on core strengthening and functional lifting and mechaincs with the goal or updated HEP and progressing to independent exercise.    Rehab Potential Good    PT Frequency 1x / week    PT Duration 4 weeks    PT Treatment/Interventions ADLs/Self Care Home Management;Cryotherapy;Therapeutic exercise;Patient/family education;Taping;Manual techniques;Dry needling;Therapeutic activities;Moist Heat    PT Next Visit Plan check HEP, FOTO post chain /postural strength.  DL, squat, response to DN PRN, continued working on aggressive shoulder and core strength. body blade    PT Home Exercise Plan Access Code: [email or web address]  see pt instructions    Consulted and Agree with Plan of Care Patient           Patient will benefit from skilled therapeutic intervention in order to improve the following deficits and impairments:  Pain,Impaired UE functional use,Impaired flexibility,Increased fascial restricitons,Postural dysfunction,Decreased strength,Obesity  Visit Diagnosis: Pain in thoracic spine  Other symptoms and signs involving the musculoskeletal system  Cervicalgia     Problem List There are no problems to display for this patient.   Starr Lake PT, DPT, LAT, ATC  09/19/20  10:15 AM      [facility] [address] [place], [place], [address] Phone: [phone]   Fax:  [phone]  Name: Ethan Brooks MRN: [id number] Date of Birth: 15-Jul-1985

## 2020-10-03 ENCOUNTER — Encounter: Payer: Self-pay | Admitting: Physical Therapy

## 2020-10-03 ENCOUNTER — Ambulatory Visit: Payer: No Typology Code available for payment source | Admitting: Physical Therapy

## 2020-10-03 ENCOUNTER — Other Ambulatory Visit: Payer: Self-pay

## 2020-10-03 DIAGNOSIS — M546 Pain in thoracic spine: Secondary | ICD-10-CM

## 2020-10-03 DIAGNOSIS — R29898 Other symptoms and signs involving the musculoskeletal system: Secondary | ICD-10-CM

## 2020-10-03 DIAGNOSIS — M542 Cervicalgia: Secondary | ICD-10-CM

## 2020-10-03 NOTE — Therapy (Signed)
[place], [place], [address] Phone: [phone]   Fax:  [phone]  Physical Therapy Treatment  Patient Details  Name: Ethan Brooks MRN: [id number] Date of Birth: 06-21-1985 Referring Provider (PT): Dr. Leeroy Cha   Encounter Date: 10/03/2020   PT End of Session - 10/03/20 1227    Visit Number 8    Number of Visits 12    Date for PT Re-Evaluation 10/31/20    Authorization Type VA    Authorization Time Period 05/22/2020 - 11/18/2020    Authorization - Visit Number 8    Authorization - Number of Visits 15    PT Start Time 1146    PT Stop Time 1227    PT Time Calculation (min) 41 min    Activity Tolerance Patient tolerated treatment well    Behavior During Therapy [facility] for tasks assessed/performed           History reviewed. No pertinent past medical history.  History reviewed. No pertinent surgical history.  There were no vitals filed for this visit.   Subjective Assessment - 10/03/20 1150    Subjective " I do get tight especially throughout the day in the low back, I did alot of rolling and exercise on the back."    Currently in Pain? Yes    Pain Score 3    this morning pain was at worst 7-8/10   Pain Location Back    Pain Orientation Lower    Pain Descriptors / Indicators Aching    Pain Type Chronic pain    Pain Onset More than a month ago    Pain Frequency Intermittent    Aggravating Factors  leaning over    Pain Relieving Factors activity              [facility] PT Assessment - 10/03/20 0001      Assessment   Medical Diagnosis cervical pain, myofascial pain    Referring Provider (PT) Dr. Leeroy Cha                         [facility] Adult PT Treatment/Exercise - 10/03/20 0001      Exercises   Exercises Lumbar;Shoulder      Lumbar Exercises: Aerobic   Elliptical L5 x 5 min ramp L10      Lumbar Exercises: Machines for Strengthening   Cybex Knee  Extension 2 x 20 35#    Cybex Knee Flexion 1 x 20 15#, 1 x 20 35#    Leg Press 2 x 20 60#   cues to avoid locking knees out     Lumbar Exercises: Standing   Heel Raises --   2 x 30 on slant board   Other Standing Lumbar Exercises bil UE press downs with elbow extend 2 x 15 15#      Shoulder Exercises: Power [employer] 15 reps   wide grip 2 x 15   Other Power Tower Exercises lat pull downs 2 x 15 35#   cues for proper form                      PT Long Term Goals - 09/19/20 0942      PT LONG TERM GOAL #1   Title Patient will increase physical activity and be consistent with daily HEP for strengthening, mobility.    Period Weeks    Status On-going      PT LONG  TERM GOAL #2   Title Patient will have normal AROM in cervical spine and no pain    Period Weeks    Status Achieved      PT LONG TERM GOAL #3   Title Pt will be able to lift with proper form , floor to waist /shoulder height with no increase in pain (50 lbs)    Period Weeks    Status Partially Met      PT LONG TERM GOAL #4   Title Pt will improve FOTO score to 79% or more to demo improved functional mobility    Period Weeks    Status On-going                 Plan - 10/03/20 1218    Clinical Impression Statement pt reports soreness int he back this morning but notes doing his exercises at home and reported improvement in pain. focused session on gross strengtheing working from lower extremity to upper which he did well with noting no increased in pain.    PT Treatment/Interventions ADLs/Self Care Home Management;Cryotherapy;Therapeutic exercise;Patient/family education;Taping;Manual techniques;Dry needling;Therapeutic activities;Moist Heat    PT Next Visit Plan check HEP, continue working gross UE/LE strengtheing and core activation    PT Home Exercise Plan Access Code: [email or web address]  see pt instructions    Consulted and Agree with Plan of Care Patient           Patient will benefit from skilled  therapeutic intervention in order to improve the following deficits and impairments:  Pain,Impaired UE functional use,Impaired flexibility,Increased fascial restricitons,Postural dysfunction,Decreased strength,Obesity  Visit Diagnosis: Pain in thoracic spine  Other symptoms and signs involving the musculoskeletal system  Cervicalgia     Problem List There are no problems to display for this patient.   Starr Lake PT, DPT, LAT, ATC  10/03/20  12:29 PM      [facility] [address] [place], [place], [address] Phone: [phone]   Fax:  [phone]  Name: Ethan Brooks MRN: [id number] Date of Birth: 07/02/1985

## 2020-10-13 ENCOUNTER — Other Ambulatory Visit: Payer: Self-pay

## 2020-10-13 ENCOUNTER — Encounter: Payer: Self-pay | Admitting: Physical Therapy

## 2020-10-13 ENCOUNTER — Ambulatory Visit
Payer: No Typology Code available for payment source | Attending: Physical Medicine and Rehabilitation | Admitting: Physical Therapy

## 2020-10-13 DIAGNOSIS — R29898 Other symptoms and signs involving the musculoskeletal system: Secondary | ICD-10-CM | POA: Diagnosis present

## 2020-10-13 DIAGNOSIS — M546 Pain in thoracic spine: Secondary | ICD-10-CM | POA: Diagnosis present

## 2020-10-13 DIAGNOSIS — M542 Cervicalgia: Secondary | ICD-10-CM | POA: Insufficient documentation

## 2020-10-13 NOTE — Therapy (Addendum)
[place], [place], [address] Phone: [phone]   Fax:  [phone]  Physical Therapy Treatment  Patient Details  Name: Ethan Brooks MRN: [id number] Date of Birth: 1986-03-03 Referring Provider (PT): Dr. Leeroy Cha   Encounter Date: 10/13/2020   PT End of Session - 10/13/20 1509    Visit Number 9    Number of Visits 12    Date for PT Re-Evaluation 10/31/20    Authorization Type VA    Authorization Time Period 05/22/2020 - 11/18/2020    Authorization - Visit Number 9    Authorization - Number of Visits 15    PT Start Time 0217    PT Stop Time 0258    PT Time Calculation (min) 41 min    Activity Tolerance Patient tolerated treatment well    Behavior During Therapy [facility] for tasks assessed/performed           History reviewed. No pertinent past medical history.  History reviewed. No pertinent surgical history.  There were no vitals filed for this visit.   Subjective Assessment - 10/13/20 1420    Subjective Pt reports he has been doing well. He has been having some sciatic pain and hamstring tightness.    Currently in Pain? Yes    Pain Score 4     Pain Location Back    Pain Type Chronic pain                             [facility] Adult PT Treatment/Exercise - 10/13/20 0001      Lumbar Exercises: Stretches   Active Hamstring Stretch 30 seconds;2 reps;Right;Left    Piriformis Stretch 2 reps;Right;Left;30 seconds   rock side to side     Lumbar Exercises: Standing   Functional Squats 15 reps   1 set with 15 lbs KB, 1 set with 25 lbs KB   Other Standing Lumbar Exercises dead lift 2x10 55#      Lumbar Exercises: Supine   Dead Bug 10 reps    Dead Bug Limitations 2x10 double leg tap      Lumbar Exercises: Prone   Other Prone Lumbar Exercises prone press up to forearm x10 prone press up onto hands 3x10    Other Prone Lumbar Exercises birddog opp arm and leg lift x10, birddog  with diagonal extension 2x10      Shoulder Exercises: Prone   Other Prone Exercises I's T's and Y's x 8 bil prone on green physioball   second set with 1# weight bil x10, cues for squeezing shoulder baldes                      PT Long Term Goals - 09/19/20 1157      PT LONG TERM GOAL #1   Title Patient will increase physical activity and be consistent with daily HEP for strengthening, mobility.    Period Weeks    Status On-going      PT LONG TERM GOAL #2   Title Patient will have normal AROM in cervical spine and no pain    Period Weeks    Status Achieved      PT LONG TERM GOAL #3   Title Pt will be able to lift with proper form , floor to waist /shoulder height with no increase in pain (50 lbs)    Period Weeks    Status Partially Met  PT LONG TERM GOAL #4   Title Pt will improve FOTO score to 79% or more to demo improved functional mobility    Period Weeks    Status On-going                 Plan - 10/13/20 1459    Clinical Impression Statement Pt reports he is having some back pain and hamstring tightness. Piriformis stretch, hamstring stretch and prone press ups were performed to help decrease pt's tightness and back pain. Continued shoulder, core and LE strengthening and he had no increase of pain during exercises. Trial of extension bias exercise despite no report of peripheralization but pt did respond well with some reduction of pain in the back. Pt reports feeling better following treatment.    PT Treatment/Interventions ADLs/Self Care Home Management;Cryotherapy;Therapeutic exercise;Patient/family education;Taping;Manual techniques;Dry needling;Therapeutic activities;Moist Heat    PT Next Visit Plan check HEP, continue working gross UE/LE strengtheing and core activation    PT Home Exercise Plan Access Code: [email or web address]  see pt instructions           Patient will benefit from skilled therapeutic intervention in order to improve the following  deficits and impairments:  Pain,Impaired UE functional use,Impaired flexibility,Increased fascial restricitons,Postural dysfunction,Decreased strength,Obesity  Visit Diagnosis: Pain in thoracic spine  Other symptoms and signs involving the musculoskeletal system  Cervicalgia     Problem List There are no problems to display for this patient.   Dawayne Cirri, SPTA 10/13/2020, 3:57 PM  [facility] [address] [place], [place], [address] Phone: [phone]   Fax:  [phone]  Name: Ethan Brooks MRN: [id number] Date of Birth: 29-Mar-1986

## 2020-10-20 ENCOUNTER — Ambulatory Visit: Payer: No Typology Code available for payment source | Admitting: Physical Therapy

## 2020-10-20 ENCOUNTER — Other Ambulatory Visit: Payer: Self-pay

## 2020-10-20 ENCOUNTER — Encounter: Payer: Self-pay | Admitting: Physical Therapy

## 2020-10-20 DIAGNOSIS — M542 Cervicalgia: Secondary | ICD-10-CM

## 2020-10-20 DIAGNOSIS — M546 Pain in thoracic spine: Secondary | ICD-10-CM | POA: Diagnosis not present

## 2020-10-20 DIAGNOSIS — R29898 Other symptoms and signs involving the musculoskeletal system: Secondary | ICD-10-CM

## 2020-10-20 NOTE — Therapy (Addendum)
[place], [place], [address] Phone: [phone]   Fax:  [phone]  Physical Therapy Treatment / Discharge  Patient Details  Name: Ethan Brooks MRN: [id number] Date of Birth: 06/19/1985 Referring Provider (PT): Dr. Leeroy Cha   Encounter Date: 10/20/2020   PT End of Session - 10/20/20 1153    Visit Number 10    Number of Visits 12    Date for PT Re-Evaluation 10/31/20    Authorization Type VA    Authorization Time Period 05/22/2020 - 11/18/2020    Authorization - Visit Number 10    Authorization - Number of Visits 15    PT Start Time 8502   pt arrived late   PT Stop Time 1230    PT Time Calculation (min) 38 min    Activity Tolerance Patient tolerated treatment well    Behavior During Therapy [facility] for tasks assessed/performed           History reviewed. No pertinent past medical history.  History reviewed. No pertinent surgical history.  There were no vitals filed for this visit.   Subjective Assessment - 10/20/20 1155    Subjective " I haven't had any pain since the last session. I did alot at home and I have been working on posture and lifting mechanics.    Currently in Pain? Yes    Pain Score 0-No pain    Aggravating Factors  N/A    Pain Relieving Factors N/A              [facility] PT Assessment - 10/20/20 0001      Assessment   Medical Diagnosis cervical pain, myofascial pain    Referring Provider (PT) Dr. Leeroy Cha      Observation/Other Assessments   Focus on Therapeutic Outcomes (FOTO)  96%                         [facility] Adult PT Treatment/Exercise - 10/20/20 0001      Lumbar Exercises: Stretches   Press Ups --   1 x 20   Press Ups Limitations progressed to add belly sag 2 x20      Lumbar Exercises: Standing   Other Standing Lumbar Exercises pistol squat from table 1 x 10 bil      Lumbar Exercises: Supine   Dead Bug 5 reps   30 sec   Other  Supine Lumbar Exercises hollow hold 10 x 10 sec   cues to keep back flat with posterior pelvic tilt     Shoulder Exercises: Standing   Other Standing Exercises D1/D2 with freemotion1 x 20 10#                  PT Education - 10/20/20 1232    Education Details Reviewed HEP, anatomy of the low back and benefits of extension bias.    Person(s) Educated Patient    Methods Explanation    Comprehension Verbalized understanding               PT Long Term Goals - 09/19/20 0942      PT LONG TERM GOAL #1   Title Patient will increase physical activity and be consistent with daily HEP for strengthening, mobility.    Period Weeks    Status On-going      PT LONG TERM GOAL #2   Title Patient will have normal AROM in cervical spine and no pain    Period Weeks  Status Achieved      PT LONG TERM GOAL #3   Title Pt will be able to lift with proper form , floor to waist /shoulder height with no increase in pain (50 lbs)    Period Weeks    Status Partially Met      PT LONG TERM GOAL #4   Title Pt will improve FOTO score to 79% or more to demo improved functional mobility    Period Weeks    Status On-going                 Plan - 10/20/20 1229    Clinical Impression Statement Ethan Brooks reported no pain coming in today and notes overall he has been doing great. continued workin gross hip, shoulder strenghtening and core activation. He reported he did get imaging on his back and reports that he has a herniated disc. Discussed benefits of extension bias which he reported continued improvement in pain. end of session he reported no pain or soreness.    PT Treatment/Interventions ADLs/Self Care Home Management;Cryotherapy;Therapeutic exercise;Patient/family education;Taping;Manual techniques;Dry needling;Therapeutic activities;Moist Heat    PT Next Visit Plan check HEP, continue working gross UE/LE strengtheing and core activation    PT Home Exercise Plan Access Code: [email or web address]   see pt instructions    Consulted and Agree with Plan of Care Patient           Patient will benefit from skilled therapeutic intervention in order to improve the following deficits and impairments:  Pain,Impaired UE functional use,Impaired flexibility,Increased fascial restricitons,Postural dysfunction,Decreased strength,Obesity  Visit Diagnosis: Pain in thoracic spine  Other symptoms and signs involving the musculoskeletal system  Cervicalgia     Problem List There are no problems to display for this patient.   Starr Lake PT, DPT, LAT, ATC  10/20/20  12:34 PM      [facility] [address] [place], [place], [address] Phone: [phone]   Fax:  [phone]  Name: Ethan Brooks MRN: [id number] Date of Birth: 07-31-1985      PHYSICAL THERAPY DISCHARGE SUMMARY  Visits from Start of Care: 10  Current functional level related to goals / functional outcomes: See goals, FOTO 96%   Remaining deficits: See assessment   Education / Equipment: HEP, theraband, posture  Plan: Patient agrees to discharge.  Patient goals were partially met. Patient is being discharged due to being pleased with the current functional level.  ?????        Joselle Deeds PT, DPT, LAT, ATC  11/07/20  12:17 PM

## 2020-10-27 ENCOUNTER — Encounter: Payer: No Typology Code available for payment source | Admitting: Physical Therapy

## 2020-11-07 ENCOUNTER — Telehealth: Payer: Self-pay | Admitting: Physical Therapy

## 2020-11-07 ENCOUNTER — Ambulatory Visit: Payer: No Typology Code available for payment source | Admitting: Physical Therapy

## 2020-11-07 NOTE — Telephone Encounter (Signed)
Spoke with pt regarding missed appointment today. He stated there were some issues with a friend he was helping out late last night, and over slept. Discussed that today was his last scheduled visit, and the plan was to discharge. He opted to go ahead and discharge today.  Noe Pittsley PT, DPT, LAT, ATC  11/07/20  12:15 PM

## 2021-07-26 ENCOUNTER — Emergency Department (HOSPITAL_BASED_OUTPATIENT_CLINIC_OR_DEPARTMENT_OTHER): Payer: No Typology Code available for payment source | Admitting: Certified Registered Nurse Anesthetist

## 2021-07-26 ENCOUNTER — Inpatient Hospital Stay: Admit: 2021-07-26 | Payer: No Typology Code available for payment source | Admitting: General Surgery

## 2021-07-26 ENCOUNTER — Other Ambulatory Visit: Payer: Self-pay

## 2021-07-26 ENCOUNTER — Emergency Department (HOSPITAL_COMMUNITY): Payer: No Typology Code available for payment source

## 2021-07-26 ENCOUNTER — Encounter (HOSPITAL_COMMUNITY): Payer: Self-pay | Admitting: Emergency Medicine

## 2021-07-26 ENCOUNTER — Ambulatory Visit (HOSPITAL_COMMUNITY)
Admission: EM | Admit: 2021-07-26 | Discharge: 2021-07-26 | Disposition: A | Discharge status: Home / Self Care | Payer: No Typology Code available for payment source | Attending: Emergency Medicine | Admitting: Emergency Medicine

## 2021-07-26 ENCOUNTER — Encounter (HOSPITAL_COMMUNITY)
Admission: EM | Disposition: A | Discharge status: Home / Self Care | Payer: Self-pay | Source: Home / Self Care | Attending: Emergency Medicine

## 2021-07-26 ENCOUNTER — Emergency Department (HOSPITAL_COMMUNITY): Payer: No Typology Code available for payment source | Admitting: Certified Registered Nurse Anesthetist

## 2021-07-26 DIAGNOSIS — W109XXA Fall (on) (from) unspecified stairs and steps, initial encounter: Secondary | ICD-10-CM | POA: Insufficient documentation

## 2021-07-26 DIAGNOSIS — S31831A Laceration without foreign body of anus, initial encounter: Secondary | ICD-10-CM | POA: Diagnosis not present

## 2021-07-26 DIAGNOSIS — S3994XA Unspecified injury of external genitals, initial encounter: Secondary | ICD-10-CM

## 2021-07-26 DIAGNOSIS — Z6835 Body mass index (BMI) 35.0-35.9, adult: Secondary | ICD-10-CM | POA: Insufficient documentation

## 2021-07-26 DIAGNOSIS — Z87891 Personal history of nicotine dependence: Secondary | ICD-10-CM | POA: Insufficient documentation

## 2021-07-26 DIAGNOSIS — Z20822 Contact with and (suspected) exposure to covid-19: Secondary | ICD-10-CM | POA: Diagnosis not present

## 2021-07-26 DIAGNOSIS — E669 Obesity, unspecified: Secondary | ICD-10-CM | POA: Insufficient documentation

## 2021-07-26 DIAGNOSIS — W19XXXA Unspecified fall, initial encounter: Secondary | ICD-10-CM

## 2021-07-26 HISTORY — PX: HEMORRHOID SURGERY: SHX153

## 2021-07-26 LAB — COMPREHENSIVE METABOLIC PANEL
ALT: 30 U/L (ref 0–44)
AST: 33 U/L (ref 15–41)
Albumin: 4.9 g/dL (ref 3.5–5.0)
Alkaline Phosphatase: 74 U/L (ref 38–126)
Anion gap: 10 (ref 5–15)
BUN: 18 mg/dL (ref 6–20)
CO2: 23 mmol/L (ref 22–32)
Calcium: 9 mg/dL (ref 8.9–10.3)
Chloride: 103 mmol/L (ref 98–111)
Creatinine, Ser: 1.03 mg/dL (ref 0.61–1.24)
GFR, Estimated: >60 mL/min (ref >60)
Glucose, Bld: 98 mg/dL (ref 70–99)
Potassium: 3.5 mmol/L (ref 3.5–5.1)
Sodium: 136 mmol/L (ref 135–145)
Total Bilirubin: 0.8 mg/dL (ref 0.3–1.2)
Total Protein: 7.5 g/dL (ref 6.5–8.1)

## 2021-07-26 LAB — CBC WITH DIFFERENTIAL/PLATELET
Abs Immature Granulocytes: 0.06 10*3/uL (ref 0.00–0.07)
Basophils Absolute: 0.1 10*3/uL (ref 0.0–0.1)
Basophils Relative: 1 %
Eosinophils Absolute: 0.1 10*3/uL (ref 0.0–0.5)
Eosinophils Relative: 1 %
HCT: 46.9 % (ref 39.0–52.0)
Hemoglobin: 16.5 g/dL (ref 13.0–17.0)
Immature Granulocytes: 1 %
Lymphocytes Relative: 31 %
Lymphs Abs: 3.9 10*3/uL (ref 0.7–4.0)
MCH: 31.5 pg (ref 26.0–34.0)
MCHC: 35.2 g/dL (ref 30.0–36.0)
MCV: 89.7 fL (ref 80.0–100.0)
Monocytes Absolute: 0.8 10*3/uL (ref 0.1–1.0)
Monocytes Relative: 6 %
Neutro Abs: 7.5 10*3/uL (ref 1.7–7.7)
Neutrophils Relative %: 60 %
Platelets: 243 10*3/uL (ref 150–400)
RBC: 5.23 MIL/uL (ref 4.22–5.81)
RDW: 11.9 % (ref 11.5–15.5)
WBC: 12.5 10*3/uL — ABNORMAL HIGH (ref 4.0–10.5)
nRBC: 0 % (ref 0.0–0.2)

## 2021-07-26 LAB — URINALYSIS, ROUTINE W REFLEX MICROSCOPIC
Bilirubin Urine: NEGATIVE
Glucose, UA: NEGATIVE mg/dL
Hgb urine dipstick: NEGATIVE
Ketones, ur: 5 mg/dL — AB
Leukocytes,Ua: NEGATIVE
Nitrite: NEGATIVE
Protein, ur: NEGATIVE mg/dL
Specific Gravity, Urine: 1.021 (ref 1.005–1.030)
pH: 6 (ref 5.0–8.0)

## 2021-07-26 LAB — RESP PANEL BY RT-PCR (FLU A&B, COVID) ARPGX2
Influenza A by PCR: NEGATIVE
Influenza B by PCR: NEGATIVE
SARS Coronavirus 2 by RT PCR: NEGATIVE

## 2021-07-26 SURGERY — HEMORRHOIDECTOMY
Anesthesia: Monitor Anesthesia Care | Site: Rectum

## 2021-07-26 MED ORDER — HYDROMORPHONE HCL 1 MG/ML IJ SOLN: 0.2500 mg | INTRAMUSCULAR | Status: DC | PRN

## 2021-07-26 MED ORDER — BACITRACIN ZINC 500 UNIT/GM EX OINT
TOPICAL_OINTMENT | CUTANEOUS | Status: DC | PRN
  Administered 2021-07-26: 1 via TOPICAL

## 2021-07-26 MED ORDER — DEXAMETHASONE SODIUM PHOSPHATE 10 MG/ML IJ SOLN
INTRAMUSCULAR | Status: AC
  Filled 2021-07-26: qty 1

## 2021-07-26 MED ORDER — BUPIVACAINE-EPINEPHRINE (PF) 0.25% -1:200000 IJ SOLN
INTRAMUSCULAR | Status: AC
  Filled 2021-07-26: qty 30

## 2021-07-26 MED ORDER — OXYCODONE HCL 5 MG PO TABS: 5.0000 mg | ORAL_TABLET | Freq: Once | ORAL | Status: DC | PRN

## 2021-07-26 MED ORDER — PROMETHAZINE HCL 25 MG/ML IJ SOLN: 6.2500 mg | INTRAMUSCULAR | Status: DC | PRN

## 2021-07-26 MED ORDER — OXYCODONE HCL 5 MG/5ML PO SOLN: 5.0000 mg | Freq: Once | ORAL | Status: DC | PRN

## 2021-07-26 MED ORDER — LIDOCAINE HCL (CARDIAC) PF 100 MG/5ML IV SOSY
PREFILLED_SYRINGE | INTRAVENOUS | Status: DC | PRN
Start: 2021-07-26 — End: 2021-07-26
  Administered 2021-07-26: 100 mg via INTRAVENOUS

## 2021-07-26 MED ORDER — FENTANYL CITRATE (PF) 100 MCG/2ML IJ SOLN
INTRAMUSCULAR | Status: DC | PRN
  Administered 2021-07-26 (×2): 100 ug via INTRAVENOUS

## 2021-07-26 MED ORDER — LACTATED RINGERS IV SOLN
INTRAVENOUS | Status: DC | PRN
Start: 2021-07-26 — End: 2021-07-26

## 2021-07-26 MED ORDER — PROPOFOL 10 MG/ML IV BOLUS
INTRAVENOUS | Status: AC
  Filled 2021-07-26: qty 20

## 2021-07-26 MED ORDER — BACITRACIN ZINC 500 UNIT/GM EX OINT: TOPICAL_OINTMENT | Freq: Two times a day (BID) | CUTANEOUS | Status: DC

## 2021-07-26 MED ORDER — ROCURONIUM BROMIDE 10 MG/ML (PF) SYRINGE
PREFILLED_SYRINGE | INTRAVENOUS | Status: AC
  Filled 2021-07-26: qty 10

## 2021-07-26 MED ORDER — SODIUM CHLORIDE (PF) 0.9 % IJ SOLN
INTRAMUSCULAR | Status: AC
  Filled 2021-07-26: qty 50

## 2021-07-26 MED ORDER — DEXAMETHASONE SODIUM PHOSPHATE 10 MG/ML IJ SOLN
INTRAMUSCULAR | Status: DC | PRN
  Administered 2021-07-26: 10 mg via INTRAVENOUS

## 2021-07-26 MED ORDER — LIDOCAINE HCL (PF) 2 % IJ SOLN
INTRAMUSCULAR | Status: AC
  Filled 2021-07-26: qty 5

## 2021-07-26 MED ORDER — ONDANSETRON HCL 4 MG/2ML IJ SOLN
INTRAMUSCULAR | Status: DC | PRN
  Administered 2021-07-26: 4 mg via INTRAVENOUS

## 2021-07-26 MED ORDER — SUGAMMADEX SODIUM 200 MG/2ML IV SOLN
INTRAVENOUS | Status: DC | PRN
  Administered 2021-07-26: 200 mg via INTRAVENOUS

## 2021-07-26 MED ORDER — MIDAZOLAM HCL 2 MG/2ML IJ SOLN
INTRAMUSCULAR | Status: DC | PRN
  Administered 2021-07-26: 2 mg via INTRAVENOUS

## 2021-07-26 MED ORDER — ONDANSETRON HCL 4 MG/2ML IJ SOLN
INTRAMUSCULAR | Status: AC
  Filled 2021-07-26: qty 2

## 2021-07-26 MED ORDER — PROPOFOL 10 MG/ML IV BOLUS
INTRAVENOUS | Status: DC | PRN
Start: 2021-07-26 — End: 2021-07-26
  Administered 2021-07-26: 200 mg via INTRAVENOUS

## 2021-07-26 MED ORDER — BACITRACIN ZINC 500 UNIT/GM EX OINT
TOPICAL_OINTMENT | CUTANEOUS | Status: AC
  Filled 2021-07-26: qty 28.35

## 2021-07-26 MED ORDER — ROCURONIUM BROMIDE 10 MG/ML (PF) SYRINGE
PREFILLED_SYRINGE | INTRAVENOUS | Status: DC | PRN
Start: 2021-07-26 — End: 2021-07-26
  Administered 2021-07-26: 80 mg via INTRAVENOUS

## 2021-07-26 MED ORDER — CEFAZOLIN SODIUM-DEXTROSE 2-4 GM/100ML-% IV SOLN
2.0000 g | Freq: Once | INTRAVENOUS | Status: AC
  Administered 2021-07-26: 2 g via INTRAVENOUS
  Filled 2021-07-26: qty 100

## 2021-07-26 MED ORDER — MIDAZOLAM HCL 2 MG/2ML IJ SOLN
INTRAMUSCULAR | Status: AC
  Filled 2021-07-26: qty 2

## 2021-07-26 MED ORDER — 0.9 % SODIUM CHLORIDE (POUR BTL) OPTIME
TOPICAL | Status: DC | PRN
  Administered 2021-07-26: 1000 mL

## 2021-07-26 MED ORDER — FENTANYL CITRATE (PF) 100 MCG/2ML IJ SOLN
INTRAMUSCULAR | Status: AC
  Filled 2021-07-26: qty 2

## 2021-07-26 MED ORDER — MORPHINE SULFATE (PF) 4 MG/ML IV SOLN
4.0000 mg | Freq: Once | INTRAVENOUS | Status: AC
  Administered 2021-07-26: 4 mg via INTRAVENOUS
  Filled 2021-07-26: qty 1

## 2021-07-26 MED ORDER — PROPOFOL 500 MG/50ML IV EMUL
INTRAVENOUS | Status: AC
  Filled 2021-07-26: qty 50

## 2021-07-26 MED ORDER — IOHEXOL 300 MG/ML  SOLN
100.0000 mL | Freq: Once | INTRAMUSCULAR | Status: AC | PRN
  Administered 2021-07-26: 100 mL via INTRAVENOUS

## 2021-07-26 MED ORDER — BUPIVACAINE-EPINEPHRINE 0.25% -1:200000 IJ SOLN
INTRAMUSCULAR | Status: DC | PRN
  Administered 2021-07-26: 20 mL

## 2021-07-26 SURGICAL SUPPLY — 33 items
BLADE HEX COATED 2.75 (ELECTRODE) ×2 IMPLANT
BLADE SURG 15 STRL LF DISP TIS (BLADE) ×1 IMPLANT
BLADE SURG 15 STRL SS (BLADE) ×1
COVER MAYO STAND STRL (DRAPES) ×2 IMPLANT
COVER SURGICAL LIGHT HANDLE (MISCELLANEOUS) ×2 IMPLANT
DRSG PAD ABDOMINAL 8X10 ST (GAUZE/BANDAGES/DRESSINGS) ×2 IMPLANT
ELECT REM PT RETURN 15FT ADLT (MISCELLANEOUS) ×2 IMPLANT
GAUZE 4X4 16PLY ~~LOC~~+RFID DBL (SPONGE) ×2 IMPLANT
GAUZE SPONGE 4X4 12PLY STRL (GAUZE/BANDAGES/DRESSINGS) ×1 IMPLANT
GLOVE SURG ENC MOIS LTX SZ7 (GLOVE) ×2 IMPLANT
GLOVE SURG ENC TEXT LTX SZ7 (GLOVE) ×1 IMPLANT
GLOVE SURG LTX SZ7 (GLOVE) ×1 IMPLANT
GLOVE SURG UNDER POLY LF SZ7 (GLOVE) ×2 IMPLANT
GLOVE SURG UNDER POLY LF SZ7.5 (GLOVE) ×4 IMPLANT
GOWN STRL REUS W/TWL LRG LVL3 (GOWN DISPOSABLE) ×4 IMPLANT
KIT BASIN OR (CUSTOM PROCEDURE TRAY) ×2 IMPLANT
KIT SIGMOIDOSCOPE (SET/KITS/TRAYS/PACK) ×1 IMPLANT
KIT TURNOVER KIT A (KITS) IMPLANT
NDL HYPO 25X1 1.5 SAFETY (NEEDLE) ×1 IMPLANT
NEEDLE HYPO 25X1 1.5 SAFETY (NEEDLE) ×2 IMPLANT
PACK BASIC VI WITH GOWN DISP (CUSTOM PROCEDURE TRAY) ×2 IMPLANT
SOL SCRUB PVP POV-IOD 4OZ 7.5% (MISCELLANEOUS) ×4
SOLUTION SCRB POV-IOD 4OZ 7.5% (MISCELLANEOUS) ×1 IMPLANT
SPIKE FLUID TRANSFER (MISCELLANEOUS) ×2 IMPLANT
SPONGE SURGIFOAM ABS GEL 12-7 (HEMOSTASIS) ×6 IMPLANT
SURGILUBE 2OZ TUBE FLIPTOP (MISCELLANEOUS) ×3 IMPLANT
SUT CHROMIC 3 0 SH 27 (SUTURE) ×3 IMPLANT
SUT ETHILON 3 0 PS 1 (SUTURE) ×2 IMPLANT
SUT VIC AB 3-0 SH 27 (SUTURE) ×1
SUT VIC AB 3-0 SH 27XBRD (SUTURE) IMPLANT
SYR CONTROL 10ML LL (SYRINGE) ×2 IMPLANT
TAPE CLOTH SURG 4X10 WHT LF (GAUZE/BANDAGES/DRESSINGS) ×1 IMPLANT
TOWEL OR 17X26 10 PK STRL BLUE (TOWEL DISPOSABLE) ×2 IMPLANT

## 2021-07-26 NOTE — Anesthesia Procedure Notes (Signed)
Procedure Name: Intubation Date/Time: 07/26/2021 9:12 PM Performed by: Raenette Rover, CRNA Pre-anesthesia Checklist: Patient identified, Emergency Drugs available, Suction available and Patient being monitored Patient Re-evaluated:Patient Re-evaluated prior to induction Oxygen Delivery Method: Circle system utilized Preoxygenation: Pre-oxygenation with 100% oxygen Induction Type: IV induction Ventilation: Mask ventilation without difficulty Laryngoscope Size: Mac and 4 Grade View: Grade I Tube type: Oral Tube size: 7.5 mm Number of attempts: 1 Airway Equipment and Method: Stylet Placement Confirmation: ETT inserted through vocal cords under direct vision, positive ETCO2 and breath sounds checked- equal and bilateral Secured at: 23 cm Tube secured with: Tape Dental Injury: Teeth and Oropharynx as per pre-operative assessment

## 2021-07-26 NOTE — Transfer of Care (Signed)
Immediate Anesthesia Transfer of Care Note  Patient: Ethan Brooks  Procedure(s) Performed: REPAIR OF PERIANAL LACERATION (Rectum)  Patient Location: PACU  Anesthesia Type:General  Level of Consciousness: awake, alert , oriented and patient cooperative  Airway & Oxygen Therapy: Patient Spontanous Breathing  Post-op Assessment: Report given to RN and Post -op Vital signs reviewed and stable  Post vital signs: Reviewed and stable  Last Vitals:  Vitals Value Taken Time  BP 131/92 07/26/21 2155  Temp    Pulse 95 07/26/21 2157  Resp 15 07/26/21 2157  SpO2 98 % 07/26/21 2157  Vitals shown include unvalidated device data.  Last Pain:  Vitals:   07/26/21 1924  TempSrc:   PainSc: 10-Worst pain ever         Complications: No notable events documented.

## 2021-07-26 NOTE — Op Note (Signed)
Preoperative diagnosis: perianal laceration after fall Postoperative diagnosis: saa Procedure: Eua/anoscopy Multilayer repair of 8 cm perianal/buttock laceration Surgeon: Dr Harden Mo Anesthesia General EBL minimal Specimens none Complications none Drains none Sponge and needle count correct at completion Disposition to recovery in stable condition  Indication: This is a 36 year old male who had a fall and had a screwdriver injury to the right buttock.  I discussed going to the operating room to repair this.  The remainder of his evaluation was negative.  Procedure: After informed consent was obtained the patient was taken to the operative room.  He was given antibiotics he is up-to-date on his tetanus.  He had SCDs in place.  He was placed under general anesthesia without complication.  He was rolled into the left lateral position.  He was appropriately padded.  He was then prepped and draped in the standard sterile surgical fashion.  Surgical timeout was then performed.  I first did an anoscopy.  This laceration did not extend into his anal mucosa.  It also did not involve his sphincters.  I then irrigated this copiously.  I then closed the deep layer which was the muscle and soft tissue with 3-0 Vicryl suture to obliterate the space.  I then brought the skin together with a combination of 3-0 chromic and 3-0 nylon sutures.  I did debride some of the dead skin and this measured about 2 x 2 cm in total.  I also did an anal block with quarter percent Marcaine and infiltrated Marcaine all around the region of the laceration.  This was hemostatic upon completion.  I placed some bacitracin and sterile dressings.  He was then rolled supine again.  He was extubated and transferred to recovery in stable condition.

## 2021-07-26 NOTE — ED Provider Triage Note (Signed)
Emergency Medicine Provider Triage Evaluation Note  Ethan Brooks , a 36 y.o. male  was evaluated in triage.  Pt complains of fall.  Patient was doing electrical work.  He states that boards that he was standing on gave way and he fell through the boards and fell down stairs.  He sustained scrapes and lacerations to his arms and legs.  He had a screwdriver in his pocket that caused pain and significant laceration to the rectal area.  Denies hitting his head, current headache, neck pain  Review of Systems  Positive: Rectal pain, abrasions Negative: Headache  Physical Exam  BP (!) 129/96 (BP Location: Left Arm)    Pulse (!) 116    Temp 98.6 F (37 C) (Oral)    Resp 18    Ht 6' (1.829 m)    Wt 117.9 kg    SpO2 100%    BMI 35.26 kg/m  Gen:   Awake, no distress   Resp:  Normal effort  MSK:   Moves extremities without difficulty  Other:  Brief exam shows significant perirectal, buttocks laceration; multiple large abrasions to the upper extremities  Medical Decision Making  Medically screening exam initiated at 4:36 PM.  Appropriate orders placed.  Nancee Liter was informed that the remainder of the evaluation will be completed by another provider, this initial triage assessment does not replace that evaluation, and the importance of remaining in the ED until their evaluation is complete.     Renne Crigler, PA-C 07/26/21 1637

## 2021-07-26 NOTE — Progress Notes (Signed)
Rt forearm road rashes, dried blood. Cleaned with NS with gause and applied antibiotic ointment.

## 2021-07-26 NOTE — ED Provider Notes (Signed)
[facility] DEPT Provider Note   CSN: [id number] Arrival date & time: 07/26/21  1606     History  Chief Complaint  Patient presents with   Ethan Brooks is a 36 y.o. male.  Patient presents to ER chief complaint of rectal pain.  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  He sustained some scratches on his forearms and thighs.  However he had a screwdriver in his back pocket which impaled him near his rectum.  Otherwise denies headache or neck pain or abdominal pain or chest pain.  Denies loss of consciousness.  No reports of fevers or cough or vomiting or diarrhea.  His last tetanus was about 2 years ago per patient.      Home Medications Prior to Admission medications   Medication Sig Start Date End Date Taking? Authorizing Provider  celecoxib (CELEBREX) 200 MG capsule TAKE 1 CAPSULE(200 MG) BY MOUTH DAILY 09/01/20   Jamse Arn, MD  cyclobenzaprine (FLEXERIL) 10 MG tablet Take 1 tablet (10 mg total) by mouth 2 (two) times daily as needed for muscle spasms. 07/17/20   Raulkar, Clide Deutscher, MD  dronabinol (MARINOL) 2.5 MG capsule Take 2.5 mg by mouth 3 (three) times daily. 2 in the am, 1 midday, 2 in the pm    [provider]  omeprazole (PRILOSEC) 10 MG capsule Take 10 mg by mouth daily.    [provider]      Allergies    Patient has no known allergies.    Review of Systems   Review of Systems  Constitutional:  Negative for fever.  HENT:  Negative for ear pain and sore throat.   Eyes:  Negative for pain.  Respiratory:  Negative for cough.   Cardiovascular:  Negative for chest pain.  Gastrointestinal:  Negative for abdominal pain.  Genitourinary:  Negative for flank pain.  Musculoskeletal:  Negative for back pain.  Skin:  Negative for color change and rash.  Neurological:  Negative for syncope.  All other systems reviewed and are  negative.  Physical Exam Updated Vital Signs BP 138/81    Pulse 80    Temp 98.6 F (37 C) (Oral)    Resp 16    Ht 6' (1.829 m)    Wt 117.9 kg    SpO2 98%    BMI 35.26 kg/m  Physical Exam Constitutional:      Appearance: He is well-developed.  HENT:     Head: Normocephalic.     Nose: Nose normal.  Eyes:     Extraocular Movements: Extraocular movements intact.  Cardiovascular:     Rate and Rhythm: Normal rate.  Pulmonary:     Effort: Pulmonary effort is normal.  Genitourinary:    Comments: Patient has a gaping laceration just lateral to his perineum, approximately 6 cm in total length, just adjacent to his anal sphincter. Skin:    Coloration: Skin is not jaundiced.     Comments: Abrasions to forearm   Neurological:     Mental Status: He is alert. Mental status is at baseline.     ED Results / Procedures / Treatments   Labs (all labs ordered are listed, but only abnormal results are displayed) Labs Reviewed  CBC WITH DIFFERENTIAL/PLATELET - Abnormal; Notable for the following components:      Result Value   WBC 12.5 (*)    All  other components within normal limits  URINALYSIS, ROUTINE W REFLEX MICROSCOPIC - Abnormal; Notable for the following components:   Ketones, ur 5 (*)    All other components within normal limits  RESP PANEL BY RT-PCR (FLU A&B, COVID) [id number]  COMPREHENSIVE METABOLIC PANEL    EKG None  Radiology DG Chest 2 View  Result Date: 07/26/2021 CLINICAL DATA:  [place] down stairs.  Neck pain. EXAM: CHEST - 2 VIEW COMPARISON:  None. FINDINGS: Normal heart, mediastinum and hila. Clear lungs.  No pleural effusion or pneumothorax. Skeletal structures are intact. IMPRESSION: No active cardiopulmonary disease. Electronically Signed   By: Lajean Manes M.D.   On: 07/26/2021 16:51   CT ABDOMEN PELVIS W CONTRAST  Result Date: 07/26/2021 CLINICAL DATA:  [place] [address] down stairs. Left buttock laceration from a screwdriver that was in his pocket. EXAM: CT ABDOMEN AND PELVIS  WITH CONTRAST TECHNIQUE: Multidetector CT imaging of the abdomen and pelvis was performed using the standard protocol following bolus administration of intravenous contrast. RADIATION DOSE REDUCTION: This exam was performed according to the departmental dose-optimization program which includes automated exposure control, adjustment of the mA and/or kV according to patient size and/or use of iterative reconstruction technique. CONTRAST:  128mL OMNIPAQUE IOHEXOL 300 MG/ML  SOLN COMPARISON:  None. FINDINGS: Lower chest: Clear lung bases. Hepatobiliary: No focal liver abnormality is seen. No gallstones, gallbladder wall thickening, or biliary dilatation. Pancreas: Unremarkable. No pancreatic ductal dilatation or surrounding inflammatory changes. Spleen: Normal in size without focal abnormality. Adrenals/Urinary Tract: Adrenal glands are unremarkable. Kidneys are normal, without renal calculi, focal lesion, or hydronephrosis. Bladder is unremarkable. Stomach/Bowel: Stomach is within normal limits. Appendix appears normal. No evidence of bowel wall thickening, distention, or inflammatory changes. Vascular/Lymphatic: No significant vascular findings are present. No enlarged abdominal or pelvic lymph nodes. Reproductive: Unremarkable. Other: Left buttock laceration reported in history is not visualized on CT. No soft tissue mass, contusion or hematoma. No abdominal wall hernia. No ascites. Musculoskeletal: No fracture or acute finding.  No bone lesion. IMPRESSION: 1. Normal enhanced CT scan of the abdomen and pelvis. No evidence of acute injury. Electronically Signed   By: Lajean Manes M.D.   On: 07/26/2021 17:51    Procedures Procedures    Medications Ordered in ED Medications  bacitracin ointment (has no administration in time range)  sodium chloride (PF) 0.9 % injection (has no administration in time range)  morphine (PF) 4 MG/ML injection 4 mg (4 mg Intravenous Given 07/26/21 1725)  iohexol (OMNIPAQUE) 300  MG/ML solution 100 mL (100 mLs Intravenous Contrast Given 07/26/21 1736)    ED Course/ Medical Decision Making/ A&P                           Medical Decision Making Amount and/or Complexity of Data Reviewed Labs: ordered. Radiology: ordered.  Risk OTC drugs. Prescription drug management.   Review of records shows an office visit December 19, 2020 with his primary doctors.  Labs today unremarkable.  CT abdomen pelvis shows no additional internal injury.  His perineal laceration appears extensive, just adjacent to rectum.  Surgical consultation requested.        Final Clinical Impression(s) / ED Diagnoses Final diagnoses:  Injury of perineum in male, initial encounter    Rx / DC Orders ED Discharge Orders     None         Luna Fuse, MD 07/26/21 1907

## 2021-07-26 NOTE — ED Notes (Signed)
Informed consent signed by patient, at bedside.

## 2021-07-26 NOTE — Consult Note (Signed)
Reason for Consult:laceration perianal Referring Physician: Dr Baxter Hire I Ethan Brooks is an 36 y.o. male.  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  Had flathead screwdriver in back pocket and sustained laceration to right gluteus that extends to perianal region but does not involve.  Brought to er.  Underwent ct scan that is negative.  I was asked to see him.   History reviewed. No pertinent past medical history.  History reviewed. No pertinent surgical history.  Family History  Problem Relation Age of Onset   Diabetes Father    Heart disease Father     Social History:  reports that he quit smoking about 13 months ago. He does not have any smokeless tobacco history on file. He reports current alcohol use of about 3.0 standard drinks per week. No history on file for drug use.  Allergies: No Known Allergies  Medications: I have reviewed the patient's current medications. No current facility-administered medications on file prior to encounter.   Current Outpatient Medications on File Prior to Encounter  Medication Sig Dispense Refill   celecoxib (CELEBREX) 200 MG capsule TAKE 1 CAPSULE(200 MG) BY MOUTH DAILY 90 capsule 0   cyclobenzaprine (FLEXERIL) 10 MG tablet Take 1 tablet (10 mg total) by mouth 2 (two) times daily as needed for muscle spasms. 30 tablet 1   dronabinol (MARINOL) 2.5 MG capsule Take 2.5 mg by mouth 3 (three) times daily. 2 in the am, 1 midday, 2 in the pm     omeprazole (PRILOSEC) 10 MG capsule Take 10 mg by mouth daily.      Results for orders placed or performed during the hospital encounter of 07/26/21 (from the past 48 hour(s))  CBC with Differential/Platelet     Status: Abnormal   Collection Time: 07/26/21  4:58 PM  Result Value Ref Range   WBC 12.5 (H) 4.0 - 10.5 K/uL   RBC 5.23 4.22 - 5.81 MIL/uL   Hemoglobin 16.5 13.0 - 17.0 g/dL   HCT 31.4 38.8 - 87.5 %   MCV 89.7 80.0 - 100.0 fL   MCH 31.5 26.0 - 34.0 pg    MCHC 35.2 30.0 - 36.0 g/dL   RDW 79.7 28.2 - 06.0 %   Platelets 243 150 - 400 K/uL   nRBC 0.0 0.0 - 0.2 %   Neutrophils Relative % 60 %   Neutro Abs 7.5 1.7 - 7.7 K/uL   Lymphocytes Relative 31 %   Lymphs Abs 3.9 0.7 - 4.0 K/uL   Monocytes Relative 6 %   Monocytes Absolute 0.8 0.1 - 1.0 K/uL   Eosinophils Relative 1 %   Eosinophils Absolute 0.1 0.0 - 0.5 K/uL   Basophils Relative 1 %   Basophils Absolute 0.1 0.0 - 0.1 K/uL   Immature Granulocytes 1 %   Abs Immature Granulocytes 0.06 0.00 - 0.07 K/uL    Comment: Performed at [facility], [address] [place], [place] [address]  Comprehensive metabolic panel     Status: None   Collection Time: 07/26/21  4:58 PM  Result Value Ref Range   Sodium 136 135 - 145 mmol/L   Potassium 3.5 3.5 - 5.1 mmol/L   Chloride 103 98 - 111 mmol/L   CO2 23 22 - 32 mmol/L   Glucose, Bld 98 70 - 99 mg/dL    Comment: Glucose reference range applies only to samples taken after fasting for at least 8 hours.   BUN 18 6 -  20 mg/dL   Creatinine, Ser 1.611.03 0.61 - 1.24 mg/dL   Calcium 9.0 8.9 - 09.610.3 mg/dL   Total Protein 7.5 6.5 - 8.1 g/dL   Albumin 4.9 3.5 - 5.0 g/dL   AST 33 15 - 41 U/L   ALT 30 0 - 44 U/L   Alkaline Phosphatase 74 38 - 126 U/L   Total Bilirubin 0.8 0.3 - 1.2 mg/dL   GFR, Estimated >04>60 >54>60 mL/min    Comment: (NOTE) Calculated using the CKD-EPI Creatinine Equation (2021)    Anion gap 10 5 - 15    Comment: Performed at [facility], [address] [place], [place] [address]  Urinalysis, Routine w reflex microscopic Urine, Clean Catch     Status: Abnormal   Collection Time: 07/26/21  4:58 PM  Result Value Ref Range   Color, Urine YELLOW YELLOW   APPearance CLEAR CLEAR   Specific Gravity, Urine 1.021 1.005 - 1.030   pH 6.0 5.0 - 8.0   Glucose, UA NEGATIVE NEGATIVE mg/dL   Hgb urine dipstick NEGATIVE NEGATIVE   Bilirubin Urine NEGATIVE NEGATIVE   Ketones, ur 5 (A) NEGATIVE mg/dL   Protein, ur  NEGATIVE NEGATIVE mg/dL   Nitrite NEGATIVE NEGATIVE   Leukocytes,Ua NEGATIVE NEGATIVE    Comment: Performed at [facility], [address] [place], [place] [address]  Resp Panel by RT-PCR (Flu A&B, Covid) Nasopharyngeal Swab     Status: None   Collection Time: 07/26/21  4:58 PM   Specimen: Nasopharyngeal Swab; Nasopharyngeal(NP) swabs in vial transport medium  Result Value Ref Range   SARS Coronavirus 2 by RT PCR NEGATIVE NEGATIVE    Comment: (NOTE) SARS-CoV-2 target nucleic acids are NOT DETECTED.  The SARS-CoV-2 RNA is generally detectable in upper respiratory specimens during the acute phase of infection. The lowest concentration of SARS-CoV-2 viral copies this assay can detect is 138 copies/mL. A negative result does not preclude SARS-Cov-2 infection and should not be used as the sole basis for treatment or other patient management decisions. A negative result may occur with  improper specimen collection/handling, submission of specimen other than nasopharyngeal swab, presence of viral mutation(s) within the areas targeted by this assay, and inadequate number of viral copies(<138 copies/mL). A negative result must be combined with clinical observations, patient history, and epidemiological information. The expected result is Negative.  Fact Sheet for Patients:  BloggerCourse.comhttps://www.fda.gov/media/152166/download  Fact Sheet for Healthcare Providers:  SeriousBroker.ithttps://www.fda.gov/media/152162/download  This test is no t yet approved or cleared by the [place] FDA and  has been authorized for detection and/or diagnosis of SARS-CoV-2 by FDA under an Emergency Use Authorization (EUA). This EUA will remain  in effect (meaning this test can be used) for the duration of the COVID-19 declaration under Section 564(b)(1) of the Act, 21 U.S.C.section 360bbb-3(b)(1), unless the authorization is terminated  or revoked sooner.       Influenza A by PCR NEGATIVE NEGATIVE    Influenza B by PCR NEGATIVE NEGATIVE    Comment: (NOTE) The Xpert Xpress SARS-CoV-2/FLU/RSV plus assay is intended as an aid in the diagnosis of influenza from Nasopharyngeal swab specimens and should not be used as a sole basis for treatment. Nasal washings and aspirates are unacceptable for Xpert Xpress SARS-CoV-2/FLU/RSV testing.  Fact Sheet for Patients: BloggerCourse.comhttps://www.fda.gov/media/152166/download  Fact Sheet for Healthcare Providers: SeriousBroker.ithttps://www.fda.gov/media/152162/download  This test is not yet approved or cleared by the [place] FDA and has been authorized for detection and/or diagnosis of SARS-CoV-2 by FDA under an  Emergency Use Authorization (EUA). This EUA will remain in effect (meaning this test can be used) for the duration of the COVID-19 declaration under Section 564(b)(1) of the Act, 21 U.S.C. section 360bbb-3(b)(1), unless the authorization is terminated or revoked.  Performed at [facility], [address] [place], [place] [address]     DG Chest 2 View  Result Date: 07/26/2021 CLINICAL DATA:  Ethan Brooks down stairs.  Neck pain. EXAM: CHEST - 2 VIEW COMPARISON:  None. FINDINGS: Normal heart, mediastinum and hila. Clear lungs.  No pleural effusion or pneumothorax. Skeletal structures are intact. IMPRESSION: No active cardiopulmonary disease. Electronically Signed   By: Amie Portland M.D.   On: 07/26/2021 16:51   CT ABDOMEN PELVIS W CONTRAST  Result Date: 07/26/2021 CLINICAL DATA:  Ethan Brooks down stairs. Left buttock laceration from a screwdriver that was in his pocket. EXAM: CT ABDOMEN AND PELVIS WITH CONTRAST TECHNIQUE: Multidetector CT imaging of the abdomen and pelvis was performed using the standard protocol following bolus administration of intravenous contrast. RADIATION DOSE REDUCTION: This exam was performed according to the departmental dose-optimization program which includes automated exposure control, adjustment of the mA and/or kV according  to patient size and/or use of iterative reconstruction technique. CONTRAST:  OMNIPAQUE IOHEXOL 300 MG/ML  SOLN COMPARISON:  None. FINDINGS: Lower chest: Clear lung bases. Hepatobiliary: No focal liver abnormality is seen. No gallstones, gallbladder wall thickening, or biliary dilatation. Pancreas: Unremarkable. No pancreatic ductal dilatation or surrounding inflammatory changes. Spleen: Normal in size without focal abnormality. Adrenals/Urinary Tract: Adrenal glands are unremarkable. Kidneys are normal, without renal calculi, focal lesion, or hydronephrosis. Bladder is unremarkable. Stomach/Bowel: Stomach is within normal limits. Appendix appears normal. No evidence of bowel wall thickening, distention, or inflammatory changes. Vascular/Lymphatic: No significant vascular findings are present. No enlarged abdominal or pelvic lymph nodes. Reproductive: Unremarkable. Other: Left buttock laceration reported in history is not visualized on CT. No soft tissue mass, contusion or hematoma. No abdominal wall hernia. No ascites. Musculoskeletal: No fracture or acute finding.  No bone lesion. IMPRESSION: 1. Normal enhanced CT scan of the abdomen and pelvis. No evidence of acute injury. Electronically Signed   By: Amie Portland M.D.   On: 07/26/2021 17:51    Review of Systems  Gastrointestinal:  Positive for rectal pain.  All other systems reviewed and are negative. Blood pressure 130/71, pulse 85, temperature 98.6 F (37 C), temperature source Oral, resp. rate 16, height 6' (1.829 m), weight 117.9 kg, SpO2 98 %. Physical Exam Constitutional:      Appearance: Normal appearance.  Eyes:     General: No scleral icterus. Cardiovascular:     Rate and Rhythm: Normal rate.  Pulmonary:     Effort: Pulmonary effort is normal.  Abdominal:     General: There is no distension.     Palpations: Abdomen is soft.     Tenderness: There is no abdominal tenderness.  Genitourinary:    Comments: 3 cm laceration that is  perianal and goes lateral onto buttock, does not involve anus it appears, some devitalization Musculoskeletal:     Cervical back: No tenderness.     Comments: Multiple abrasions both ue, mildly tender right forearm  Skin:    Capillary Refill: Capillary refill takes less than 2 seconds.  Neurological:     General: No focal deficit present.     Mental Status: He is alert.  Psychiatric:        Mood and Affect: Mood normal.  Behavior: Behavior normal.    Assessment/Plan: Fall -will check xray right forearm -likely could do in er but he would prefer to do in OR- will wash out, close loosely and possibly pack.  Abx to be given, tetanus utd, will do rectal exam (not done yet) once in OR -can dc home postop -will follow up in office  I reviewed ED provider notes, last 24 h vitals and pain scores, last 24 h labs and trends, and last 24 h imaging results.  This care required moderate level of medical decision making.   Emelia Loron 07/26/2021, 7:21 PM

## 2021-07-26 NOTE — ED Triage Notes (Signed)
Patient reports fall from attic stairs down basement stairs. Fell approx 10 stairs. Denies head injury and LOC. Abrasion to R arm. C/o rectal pain and bleeding from screw driver in patient's pocket during the time of the fall.

## 2021-07-26 NOTE — Discharge Instructions (Signed)
[facility]  RECTAL SURGERY POST OP INSTRUCTIONS: POST OP INSTRUCTIONS  Always review your discharge instruction sheet given to you by the facility where your surgery was performed. IF YOU HAVE DISABILITY OR FAMILY LEAVE FORMS, YOU MUST BRING THEM TO THE OFFICE FOR PROCESSING.   DO NOT GIVE THEM TO YOUR DOCTOR.  A  prescription for pain medication may be given to you upon discharge.  Take your pain medication as prescribed, if needed.  If narcotic pain medicine is not needed, then you may take acetaminophen (Tylenol) or ibuprofen (Advil) as needed. Take your usually prescribed medications unless otherwise directed. If you need a refill on your pain medication, please contact your pharmacy.  They will contact our office to request authorization. Prescriptions will not be filled after 5 pm or on week-ends. You should follow a light diet the first 48 hours after arrival home, such as soup and crackers, etc.  Be sure to include lots of fluids daily.  Resume your normal diet 2-3 days after surgery.. Most patients will experience some swelling and discomfort in the rectal area. Ice packs, reclining and warm tub soaks will help.  Swelling and discomfort can take several days to resolve.  It is common to experience some constipation if taking pain medication after surgery.  Increasing fluid intake and taking a stool softener (such as Colace) will usually help or prevent this problem from occurring.  A mild laxative (Milk of Magnesia or Miralax) should be taken according to package directions if there are no bowel movements after 48 hours. Unless discharge instructions indicate otherwise, leave your bandage dry and in place for 24 hours, or remove the bandage if you have a bowel movement. You may notice a small amount of bleeding with bowel movements for the first few days. You may have some packing in the rectum which will come out over the first day or two. You will need to wear an  absorbent pad or soft cotton gauze in your underwear until the drainage stops.it. ACTIVITIES:  You may resume regular (light) daily activities beginning the next day--such as daily self-care, walking, climbing stairs--gradually increasing activities as tolerated.  You may have sexual intercourse when it is comfortable.  Refrain from any heavy lifting or straining until approved by your doctor. You may drive when you are no longer taking prescription pain medication, you can comfortably wear a seatbelt, and you can safely maneuver your car and apply brakes. RETURN TO WORK: : ____________________  You should see your doctor in the office for a follow-up appointment approximately 2-3 weeks after your surgery.  Make sure that you call for this appointment within a day or two after you arrive home to insure a convenient appointment time. OTHER INSTRUCTIONS:  __________________________________________________________________________________________________________________________________________________________________________________________  WHEN TO CALL YOUR DOCTOR: Fever over 101.0 Inability to urinate Nausea and/or vomiting Extreme swelling or bruising Continued bleeding from rectum. Increased pain, redness, or drainage from the incision Constipation  The clinic staff is available to answer your questions during regular business hours.  Please don't hesitate to call and ask to speak to one of the nurses for clinical concerns.  If you have a medical emergency, go to the nearest emergency room or call 911.  A surgeon from [facility] is always on call at the hospital   [address], [address], [place], [place]  [address] ?  [address] [address], [place], [place]   [address] [phone] ? [phone] ? FAX [phone] Web site:   [email or web address] ° °

## 2021-07-26 NOTE — Anesthesia Postprocedure Evaluation (Signed)
Anesthesia Post Note  Patient: Ethan Brooks  Procedure(s) Performed: EXAM UNDER ANESTHESIA, REPAIR OF PERIANAL LACERATION (Rectum)     Patient location during evaluation: PACU Anesthesia Type: General Level of consciousness: awake and alert Pain management: pain level controlled Vital Signs Assessment: post-procedure vital signs reviewed and stable Respiratory status: spontaneous breathing, nonlabored ventilation and respiratory function stable Cardiovascular status: blood pressure returned to baseline and stable Postop Assessment: no apparent nausea or vomiting Anesthetic complications: no   No notable events documented.  Last Vitals:  Vitals:   07/26/21 2210 07/26/21 2215  BP: 131/81 131/84  Pulse: 84 87  Resp: 13 20  Temp: 36.9 C   SpO2: 100% 98%    Last Pain:  Vitals:   07/26/21 2210  TempSrc:   PainSc: 0-No pain                 Lowella Curb

## 2021-07-26 NOTE — Anesthesia Preprocedure Evaluation (Signed)
Anesthesia Evaluation  Patient identified by MRN, date of birth, ID band Patient awake    Reviewed: Allergy & Precautions, NPO status , Patient's Chart, lab work & pertinent test results  Airway Mallampati: II  TM Distance: >3 FB Neck ROM: Full    Dental no notable dental hx.    Pulmonary neg pulmonary ROS, former smoker,    Pulmonary exam normal breath sounds clear to auscultation       Cardiovascular negative cardio ROS Normal cardiovascular exam Rhythm:Regular Rate:Normal     Neuro/Psych negative neurological ROS  negative psych ROS   GI/Hepatic negative GI ROS, Neg liver ROS,   Endo/Other  negative endocrine ROS  Renal/GU negative Renal ROS  negative genitourinary   Musculoskeletal negative musculoskeletal ROS (+)   Abdominal (+) + obese,   Peds negative pediatric ROS (+)  Hematology negative hematology ROS (+)   Anesthesia Other Findings   Reproductive/Obstetrics negative OB ROS                             Anesthesia Physical Anesthesia Plan  ASA: 2 and emergent  Anesthesia Plan: MAC   Post-op Pain Management:    Induction: Intravenous  PONV Risk Score and Plan: 1 and Ondansetron and Treatment may vary due to age or medical condition  Airway Management Planned: Simple Face Mask  Additional Equipment:   Intra-op Plan:   Post-operative Plan:   Informed Consent: I have reviewed the patients History and Physical, chart, labs and discussed the procedure including the risks, benefits and alternatives for the proposed anesthesia with the patient or authorized representative who has indicated his/her understanding and acceptance.     Dental advisory given  Plan Discussed with: CRNA  Anesthesia Plan Comments:         Anesthesia Quick Evaluation

## 2021-07-27 ENCOUNTER — Encounter (HOSPITAL_COMMUNITY): Payer: Self-pay | Admitting: General Surgery

## 2022-04-14 IMAGING — CT CT ABD-PELV W/ CM
2 of 5 series · 16 of 46 positions shown, 18 images · IV contrast (agent unspecified)
Comparison: None.

CLINICAL DATA: Fell down stairs. Left buttock laceration from a
screwdriver that was in his pocket.

EXAM:
CT ABDOMEN AND PELVIS WITH CONTRAST
TECHNIQUE: Multidetector CT imaging of the abdomen and pelvis was performed
using the standard protocol following bolus administration of
intravenous contrast.

[Series 2: axial st · axial · 0.97mm/px · z∈[+1027,+1522]mm · 13 of 115 slices shown, 15 images]
[im 8/115  soft-tissue]
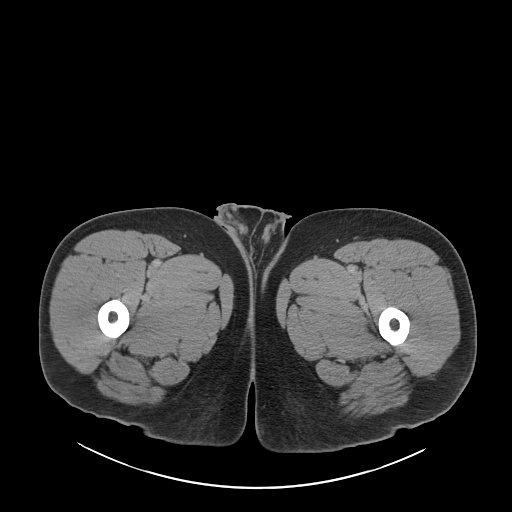
[im 8/115  bone]
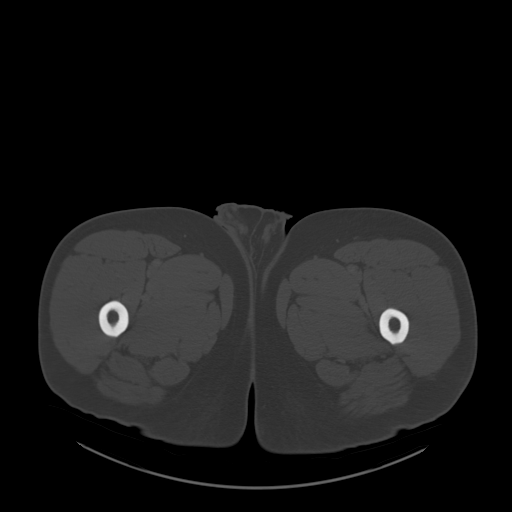
[im 16/115  soft-tissue]
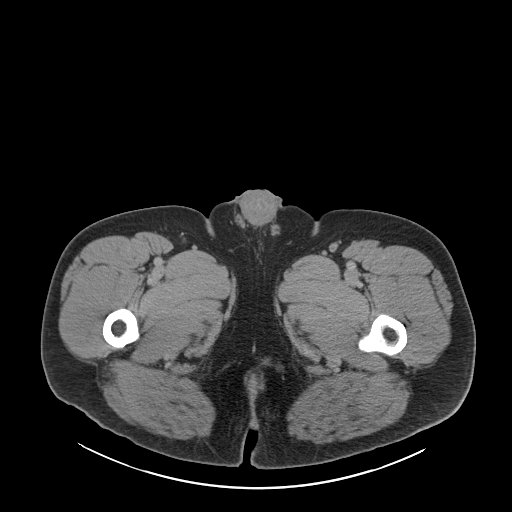
[im 23/115  soft-tissue]
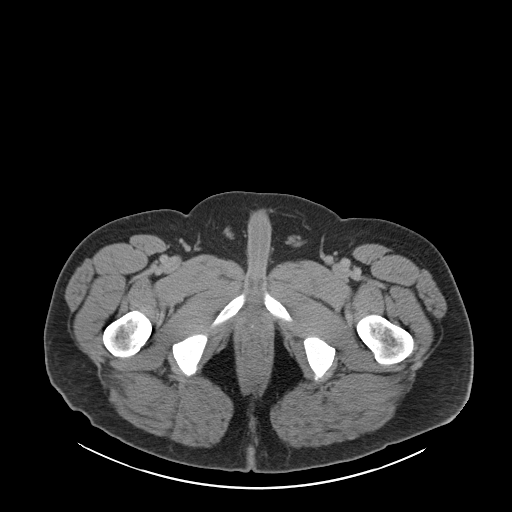
[im 31/115  soft-tissue]
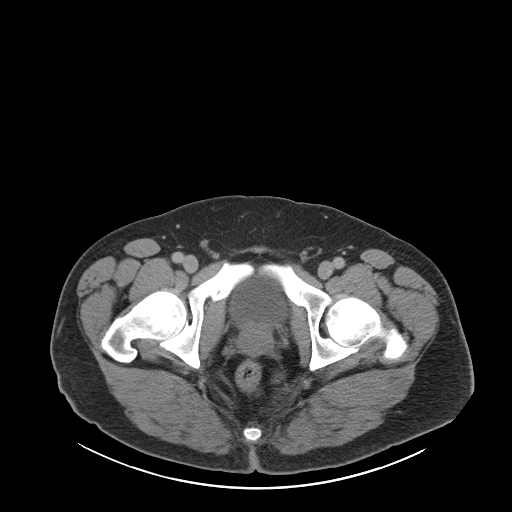
[im 39/115  soft-tissue]
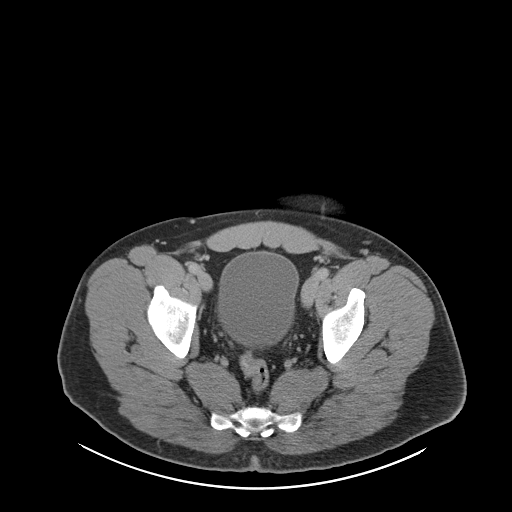
[im 46/115  soft-tissue]
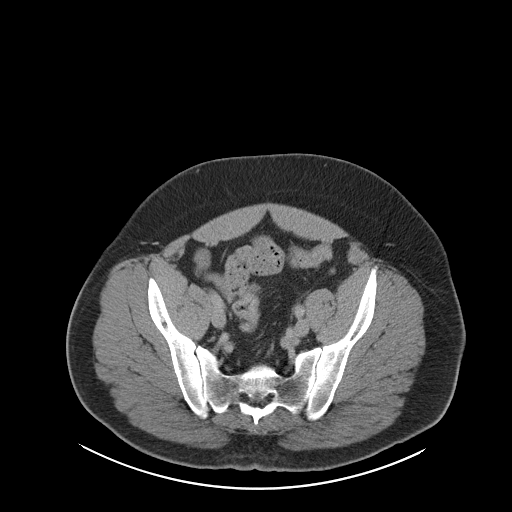
[im 61/115  soft-tissue]
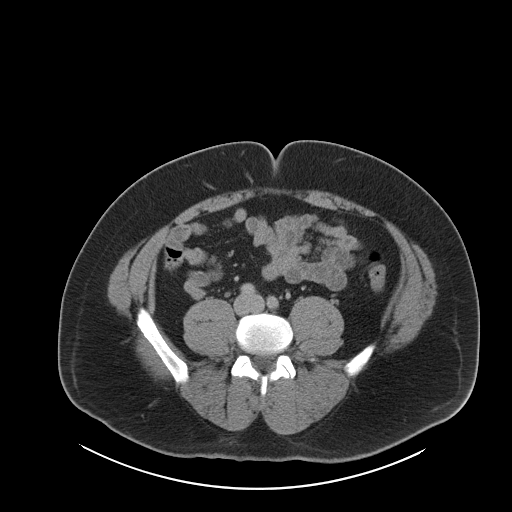
[im 69/115  soft-tissue]
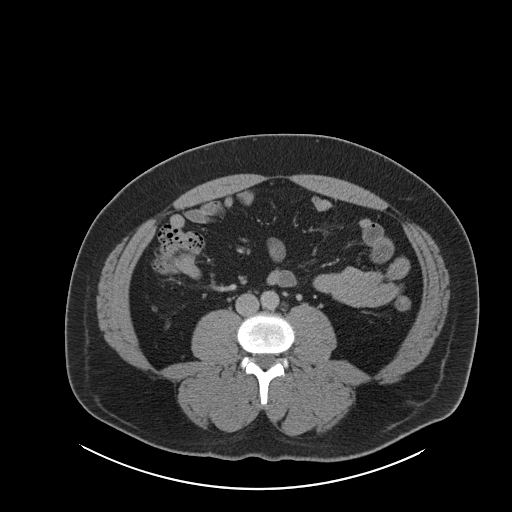
[im 77/115  soft-tissue]
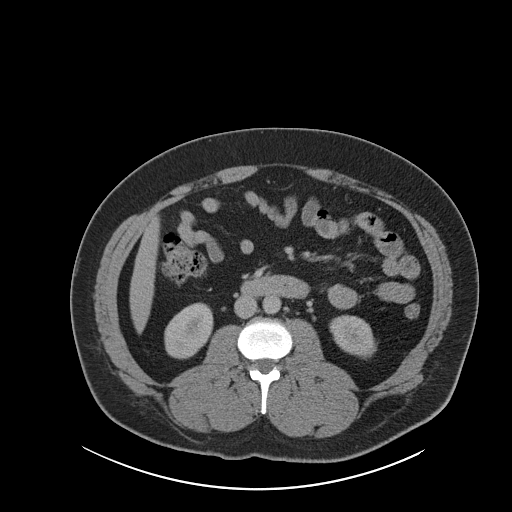
[im 77/115  bone]
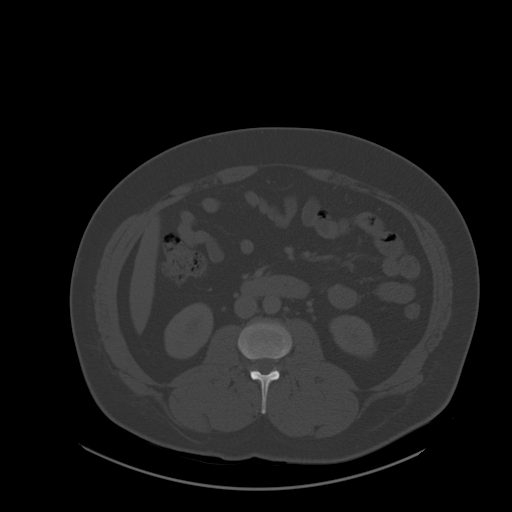
[im 84/115  soft-tissue]
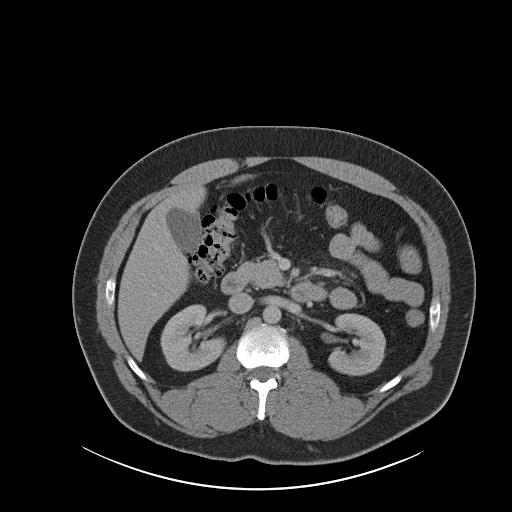
[im 92/115  soft-tissue]
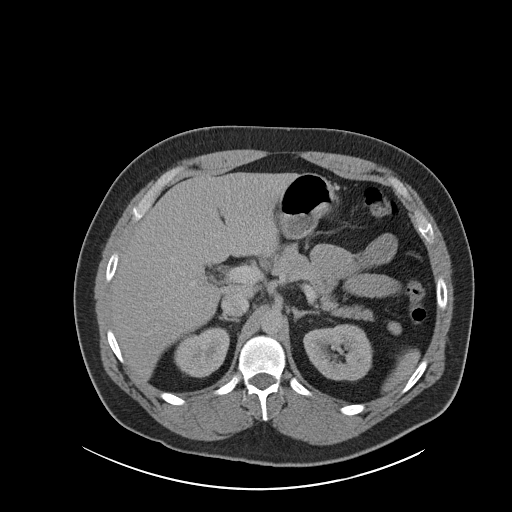
[im 99/115  soft-tissue]
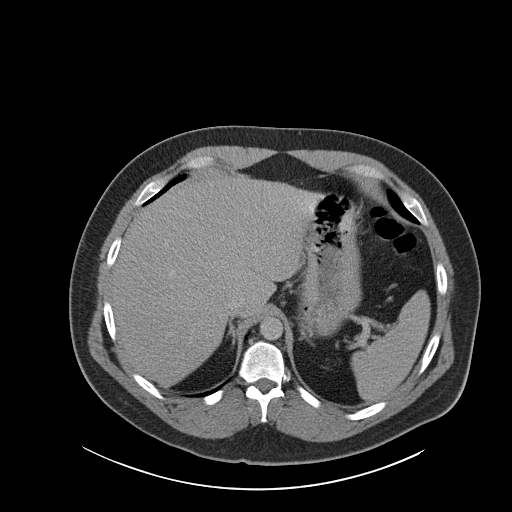
[im 107/115  soft-tissue]
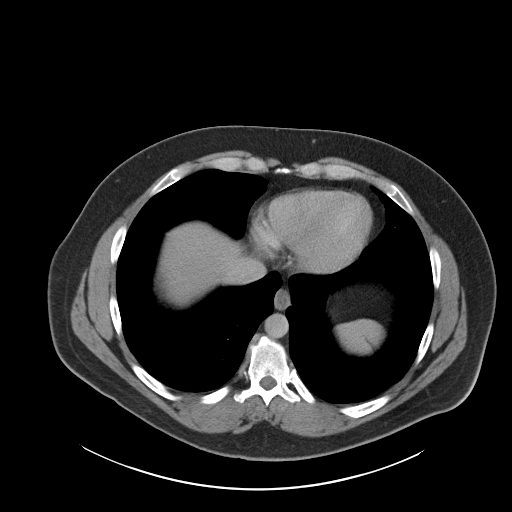

[Series 5: coronal st · coronal · 1.09mm/px · 3 of 171 slices shown]
[im 57/171  soft-tissue]
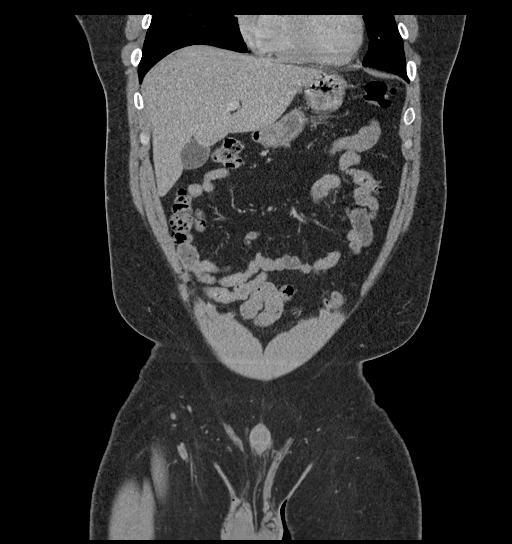
[im 76/171  soft-tissue]
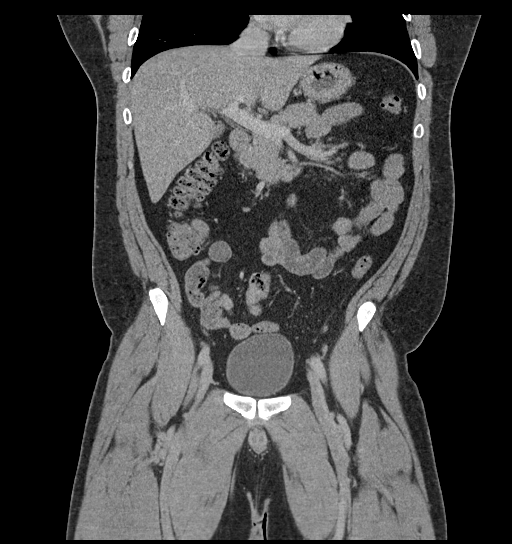
[im 95/171  soft-tissue]
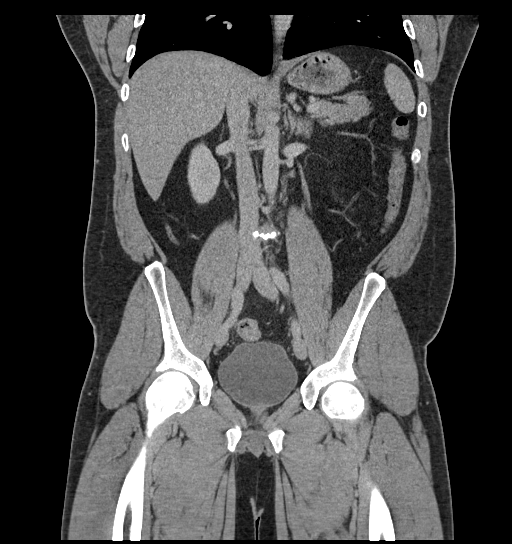

[16 of 46 positions shown; findings below may reference images not displayed]

RADIATION DOSE REDUCTION: This exam was performed according to the
departmental dose-optimization program which includes automated
exposure control, adjustment of the mA and/or kV according to
patient size and/or use of iterative reconstruction technique.

CONTRAST:  100mL OMNIPAQUE IOHEXOL 300 MG/ML  SOLN
FINDINGS: Lower chest: Clear lung bases.

Hepatobiliary: No focal liver abnormality is seen. No gallstones,
gallbladder wall thickening, or biliary dilatation.

Pancreas: Unremarkable. No pancreatic ductal dilatation or
surrounding inflammatory changes.

Spleen: Normal in size without focal abnormality.

Adrenals/Urinary Tract: Adrenal glands are unremarkable. Kidneys are
normal, without renal calculi, focal lesion, or hydronephrosis.
Bladder is unremarkable.

Stomach/Bowel: Stomach is within normal limits. Appendix appears
normal. No evidence of bowel wall thickening, distention, or
inflammatory changes.

Vascular/Lymphatic: No significant vascular findings are present. No
enlarged abdominal or pelvic lymph nodes.

Reproductive: Unremarkable.

Other: Left buttock laceration reported in history is not visualized
on CT. No soft tissue mass, contusion or hematoma. No abdominal wall
hernia. No ascites.

Musculoskeletal: No fracture or acute finding.  No bone lesion.
IMPRESSION: 1. Normal enhanced CT scan of the abdomen and pelvis. No evidence of
acute injury.

## 2024-01-04 ENCOUNTER — Ambulatory Visit: Admitting: Physician Assistant
